# Patient Record
Sex: Male | Born: 1950
Health system: Southern US, Community
[De-identification: ages and names within clinical notes are randomized; demographics above are authoritative.]

## PROBLEM LIST (undated history)

## (undated) DIAGNOSIS — E785 Hyperlipidemia, unspecified: Secondary | ICD-10-CM

## (undated) DIAGNOSIS — I10 Essential (primary) hypertension: Secondary | ICD-10-CM

## (undated) DIAGNOSIS — E119 Type 2 diabetes mellitus without complications: Secondary | ICD-10-CM

## (undated) HISTORY — PX: SPINAL CORD STIMULATOR IMPLANT: SHX2422

## (undated) HISTORY — DX: Type 2 diabetes mellitus without complications: E11.9

## (undated) HISTORY — DX: Essential (primary) hypertension: I10

## (undated) HISTORY — DX: Hyperlipidemia, unspecified: E78.5

## (undated) HISTORY — PX: NECK SURGERY: SHX720

## (undated) HISTORY — PX: BACK SURGERY: SHX140

---

## 2004-02-24 ENCOUNTER — Inpatient Hospital Stay (HOSPITAL_COMMUNITY): Admission: RE | Admit: 2004-02-24 | Discharge: 2004-02-26 | Payer: Self-pay | Admitting: Neurosurgery

## 2004-04-03 ENCOUNTER — Ambulatory Visit (HOSPITAL_COMMUNITY): Admission: RE | Admit: 2004-04-03 | Discharge: 2004-04-03 | Payer: Self-pay | Admitting: Neurosurgery

## 2004-06-01 ENCOUNTER — Ambulatory Visit (HOSPITAL_COMMUNITY): Admission: RE | Admit: 2004-06-01 | Discharge: 2004-06-01 | Payer: Self-pay | Admitting: Neurosurgery

## 2004-06-28 ENCOUNTER — Inpatient Hospital Stay (HOSPITAL_COMMUNITY): Admission: RE | Admit: 2004-06-28 | Discharge: 2004-06-30 | Payer: Self-pay | Admitting: Neurosurgery

## 2004-08-09 ENCOUNTER — Inpatient Hospital Stay (HOSPITAL_COMMUNITY): Admission: RE | Admit: 2004-08-09 | Discharge: 2004-08-11 | Payer: Self-pay | Admitting: Neurosurgery

## 2005-03-23 ENCOUNTER — Ambulatory Visit (HOSPITAL_COMMUNITY): Admission: RE | Admit: 2005-03-23 | Discharge: 2005-03-23 | Payer: Self-pay | Admitting: Neurosurgery

## 2005-03-28 ENCOUNTER — Ambulatory Visit (HOSPITAL_COMMUNITY): Admission: RE | Admit: 2005-03-28 | Discharge: 2005-03-30 | Payer: Self-pay | Admitting: Neurosurgery

## 2005-05-02 ENCOUNTER — Ambulatory Visit (HOSPITAL_COMMUNITY): Admission: RE | Admit: 2005-05-02 | Discharge: 2005-05-02 | Payer: Self-pay | Admitting: Neurosurgery

## 2005-06-29 ENCOUNTER — Ambulatory Visit (HOSPITAL_COMMUNITY): Admission: RE | Admit: 2005-06-29 | Discharge: 2005-06-29 | Payer: Self-pay | Admitting: Neurosurgery

## 2005-07-02 ENCOUNTER — Observation Stay (HOSPITAL_COMMUNITY): Admission: RE | Admit: 2005-07-02 | Discharge: 2005-07-04 | Payer: Self-pay | Admitting: Neurosurgery

## 2005-09-12 ENCOUNTER — Ambulatory Visit (HOSPITAL_COMMUNITY): Admission: RE | Admit: 2005-09-12 | Discharge: 2005-09-12 | Payer: Self-pay | Admitting: Neurosurgery

## 2006-02-27 IMAGING — RF DG LUMBAR SPINE 2-3V
1 series · 2 of 2 positions shown · non-contrast
Comparison: none

CLINICAL DATA: Herniated disk, stimulator.
 LUMBAR SPINE TWO VIEWS
 Two spot images from C-arm fluoroscopy document placement of a stimulator device in the posterior aspect of the spinal canal in the thoracic spine.  The exact level cannot be determined from the information on the films.  
 IMPRESSION
 Thoracic stimulator device placement.

[Series 1: run · 2 of 2 slices shown]
[im 1/2]
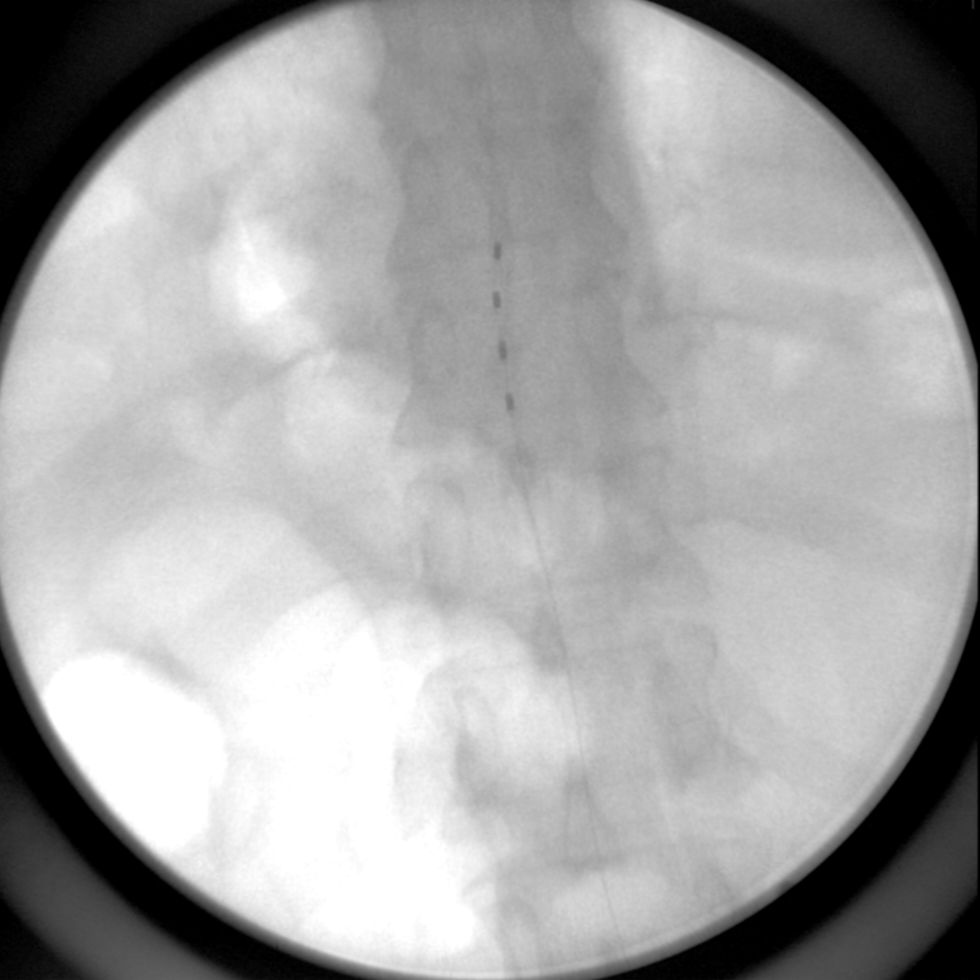
[im 2/2]
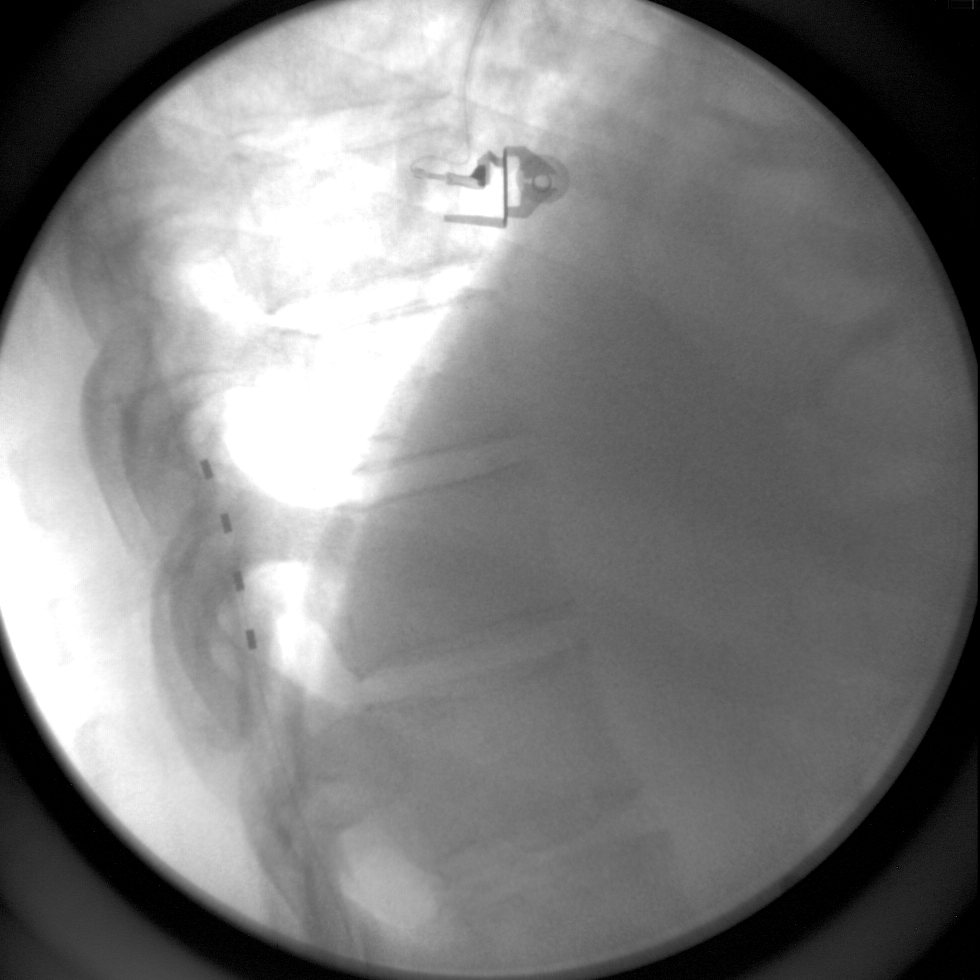

[2 of 2 positions shown; findings below may reference images not displayed]

## 2006-05-07 IMAGING — RF DG THORACIC SPINE 2V
1 series · 2 of 2 positions shown · non-contrast
Comparison: none

CLINICAL DATA: Revision of spinal cord stimulator.
 THORACIC SPINE TWO VIEW, 08/09/04
 Two intraoperative spot images are submitted.  This shows wires in place compatible with placement of spinal cord stimulator.  This appears to be located in the lower thoracic region although this is difficult to determine on these intraoperative spot images. 
 IMPRESSION
 Placement of spinal cord stimulator, likely in the lower thoracic region.

[Series 1: run · 2 of 2 slices shown]
[im 1/2]
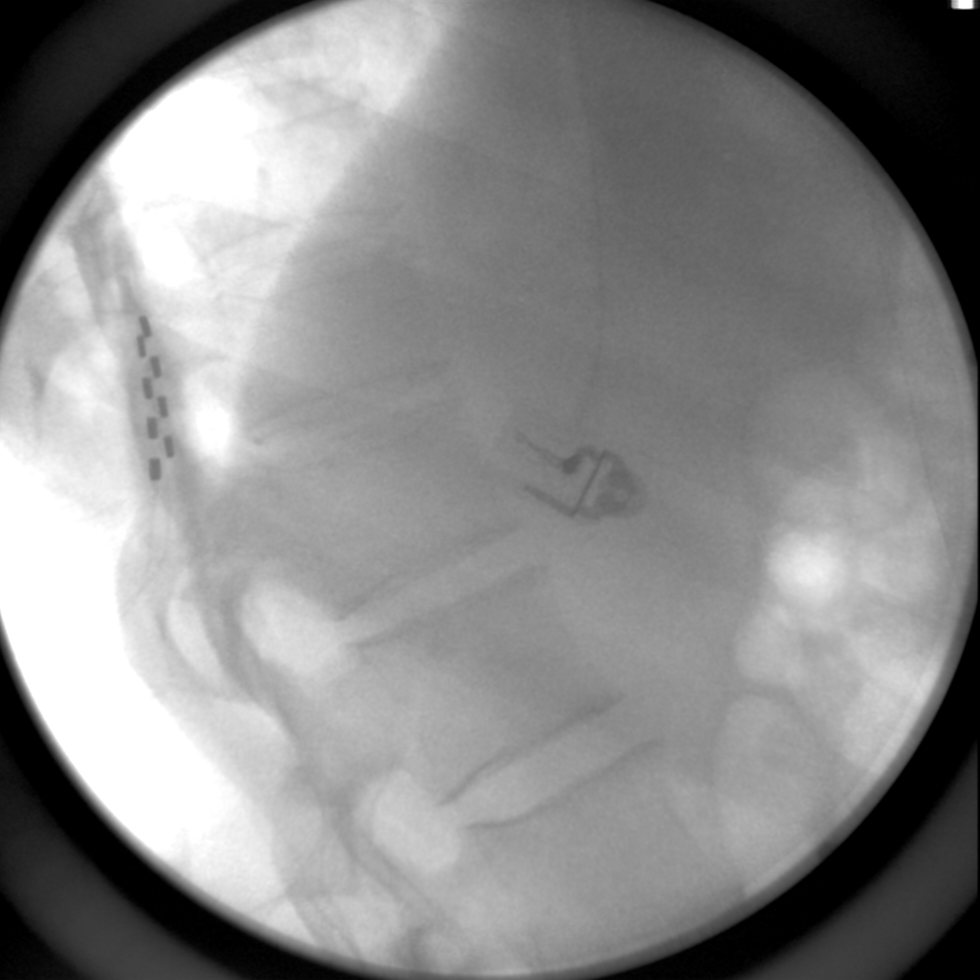
[im 2/2]
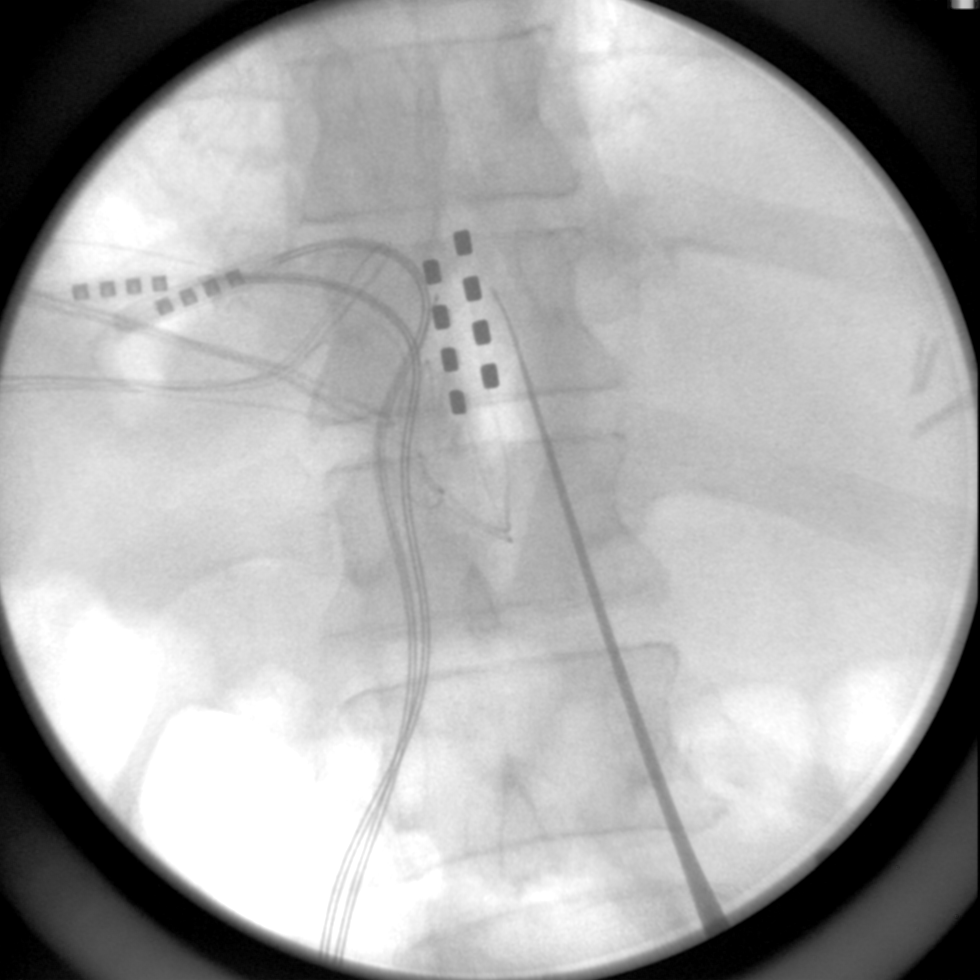

[2 of 2 positions shown; findings below may reference images not displayed]

## 2008-08-09 ENCOUNTER — Ambulatory Visit (HOSPITAL_COMMUNITY): Admission: RE | Admit: 2008-08-09 | Discharge: 2008-08-09 | Payer: Self-pay | Admitting: Family Medicine

## 2009-01-25 ENCOUNTER — Ambulatory Visit (HOSPITAL_COMMUNITY): Admission: RE | Admit: 2009-01-25 | Discharge: 2009-01-25 | Payer: Self-pay | Admitting: Family Medicine

## 2011-03-30 NOTE — Op Note (Signed)
NAME:  Anthony Espinoza, Anthony Espinoza                         ACCOUNT NO.:  [id number]   MEDICAL RECORD NO.:  [id number]                   PATIENT TYPE:  INP   LOCATION:  3008                                 FACILITY:  [facility]   PHYSICIAN:  Hewitt Shorts, M.D.            DATE OF BIRTH:  [birth year]   DATE OF PROCEDURE:  06/28/2004  DATE OF DISCHARGE:                                 OPERATIVE REPORT   PREOPERATIVE DIAGNOSIS:  Chronic right lumbar radicular pain.   POSTOPERATIVE DIAGNOSIS:  Chronic right lumbar radicular pain.   OPERATION/PROCEDURE:  Implantation of permanent spinal cord stimulator  ([employer]) via right inferior T10 laminotomy.   SURGEON:  Hewitt Shorts, M.D.   ANESTHESIA:  General endotracheal.   INDICATIONS:  The patient is a 60 year old man, status post a lumbar  diskectomy.  He has history of an old cervical cord injury and had disabling  pain postoperatively.  He has been under chronic pain management and  requested implantation of a spinal cord stimulator.  A successful trial was  performed and the patient has requested placement of a permanent spinal cord  stimulator.   DESCRIPTION OF PROCEDURE:  The patient was brought to the operating room and  placed under general endotracheal anesthesia.  The patient was turned to a  prone position.  The thoracic and lumbar regions were prepped with Betadine  soap and solution and draped in a sterile fashion.  C-arm fluoroscopy was  used to localize the T10-11 interlaminar space and then subcutaneous tissue  was infiltrated with local anesthetic and epinephrine and midline incisions  were made over the T10-11 area, carried down through the subcutaneous  tissue, carried down to the lumbar fascia. Bipolar electrocautery used to  maintain hemostasis.  The dorsal fascia was incised, the paraspinal muscles,  dissection of the spinous process of the lamina of the subperiosteal fascia.  Using C-arm fluoroscopy, we were again able to  identify the right T10-11  interlaminar space and then a laminotomy was performed using the X-max drill  and Kerrison punches.  Ligamentous flavum was carefully removed and the  thecal sac was identified.  The specified hinged lead was positioned using C-  arm fluoroscopy for guidance and positioned over the T9 and T10 level with  the left-sided leads at the midline and the right-sided leads to the right  of the midline.  Once they were successfully positioned, the dorsal fascia  was closed with interrupted undyed 1 inverted running 0 Vicryl sutures.  The  leads were secured with anchor secured with 2-0 silk suture.  We then made  an incision over the superior left buttock. The skin was then then sprayed  with local epinephrine.  Dissection was carried down through the  subcutaneous tissue and a subcutaneous pocket was created.  Then a tunneler  was passed from the thoracic to the left buttock incision and the __________  midline extensions were passed each __________  to the leads.  The hex  screws were secured with hex torque driver and boots were passed over the  connection and secured with 0 silk ties.  The extra electrode leads were  gently coiled and positioned in the subcutaneous space and the midline  extensions were connected to the Synergy pulse generator.  Four hex locking  screws were secured with a hex screwdriver and then the Synergy pulse  generator was carefully positioned in the subcutaneous pocket with the extra  portion of the extension wiring positioned under the pulse generator.  The  thoracic incision was closed with interrupted inverted 2-0 Vicryl sutures  and the skin closed with Dermabond.  The buttocks incision was closed with  interrupted inverted 0 Vicryl sutures to close the deep subcutaneous layer  and then the 2-0 interrupted sutures were placed in inverted  fascia and used to close the subcutaneous layer.  Skin was closed with  Dermabond.  The patient  tolerated the procedure well.  Estimated blood loss  was less than 25 mL.  Sponge count correct following the surgery.  The  patient is to be reversed anesthetic, extubated, and transferred to the  recovery room for further care.                                               Hewitt Shorts, M.D.    RWN/MEDQ  D:  06/28/2004  T:  06/28/2004  Job:  [id number]

## 2011-03-30 NOTE — Op Note (Signed)
NAMEISHMEL, ACEVEDO NO.:  [id number]   MEDICAL RECORD NO.:  [id number]          PATIENT TYPE:  OBV   LOCATION:  3035                         FACILITY:  [facility]   PHYSICIAN:  Hewitt Shorts, M.D.DATE OF BIRTH:  14-Jul-1951   DATE OF PROCEDURE:  07/02/2005  DATE OF DISCHARGE:                                 OPERATIVE REPORT   PREOPERATIVE DIAGNOSIS:  Recurrent right L4-5 lumbar disk herniation, lumbar  degenerative disk disease, lumbar spondylosis and lumbar radiculopathy.   POSTOP DIAGNOSIS:  Same.   PROCEDURE:  Right L4-5 lumbar laminotomy and microdiskectomy.   SURGEON:  Hewitt Shorts, M.D.   ASSISTANTJeral Fruit   ANESTHESIA:  General endotracheal.   INDICATIONS:  The patient is a 60 year old man who is status post a right L5-  S1 diskectomy in the spring of 2005 and the spring of 2006. He  suffered a  recurrent disk herniation discussion on the right at L5-S1 and a new right  L4-5 disk herniation. He underwent a diskectomy at each level.  He has since  suffered a recurrent disk herniation at the L4-5 level.  There is no  evidence of recurrence at the L5-S1 level. Decision made to return to the  operating room for a right L4-5 diskectomy for recurrent discussion.   PROCEDURE:  Patient brought to the operating room and placed under general  endotracheal anesthesia.  The patient was turned to a prone position, lumbar  region was prepped with Betadine soap solution, draped in sterile fashion.  An x-ray was taken and the L4-5 level identified.  The midline was  infiltrated with local anesthetic with epinephrine. The midline incision  made over the L4-5 level and carried down through subcutaneous tissue.  Incision was made through the previous midline incision.  Dissection was  carried down through subcutaneous tissue to the lumbar fascia. Bipolar  cautery and electrocautery were used to maintain hemostasis, lumbar fascia  was incised in the right side  the midline. The paraspinal muscles dissected  from the spinous process and lamina in subperiosteal fashion. An L4-5  laminotomy was noted and x-rays taken to confirm localization. The  microscope was draped and brought into the field to provide additional  magnification illumination, visualization. The remainder of the  decompression was performed using microdissection microsurgical technique.  The scar tissue within the laminotomy defect was carefully dissected from  the edge of the laminotomy and then the laminotomy was extended rostrally  and laterally using the X Max drill and Kerrison punches. We then dissected  the epidural space and gently retracted the thecal sac and exiting right L5  nerve root medially. The disk space was identified and we entered into the  disk space. Diskectomy was initiated and we were able to mobilize the disk  herniation which was a large fragment.  Additional small fragments of  degenerated disk material were removed as well and the thecal sac and nerve  root were freed up and good decompression of these neural structures was  achieved.  Spondylitic overgrowth on the posterior aspect of the inferior  portion of L5 was  removed using osteophyte removal tool.  In the end the  wound was irrigated bacitracin solution. All loose fragments of disk  material were removed from both disk space and the epidural space.  Hemostasis established with the use of both bipolar cautery as well as  Gelfoam soaked in thrombin and once the diskectomy was completed, hemostasis  established, we instilled 2 mL of fentanyl into the epidural space and  proceed with closure. The deep fascia closed interrupted undyed one Vicryl  sutures. The deep subcutaneous layer was closed interrupted inverted 2-0  undyed Vicryl sutures.  The subcutaneous and subcuticular layer closed  interrupted inverted 2-0 undyed Vicryl sutures. Skin edges were closed with  running locking 3-0 nylon suture.  The  wound was dressed with Adaptic and  sterile gauze. The patient tolerated the procedure well.  The estimated  blood  was 50 mL. Sponge count correct. Following surgery the patient was  turned back to supine position to be reversed from anesthetic, extubated,  transferred to recovery room for further care where he was noted to be  moving all four extremities to command.      Hewitt Shorts, M.D.  Electronically Signed     RWN/MEDQ  D:  07/02/2005  T:  07/03/2005  Job:  [id number]

## 2011-03-30 NOTE — Op Note (Signed)
NAME:  Anthony Espinoza, Anthony Espinoza                         ACCOUNT NO.:  [id number]   MEDICAL RECORD NO.:  [id number]                   PATIENT TYPE:  OIB   LOCATION:  2873                                 FACILITY:  [facility]   PHYSICIAN:  Hewitt Shorts, M.D.            DATE OF BIRTH:  May 21, 1951   DATE OF PROCEDURE:  DATE OF DISCHARGE:  06/01/2004                                 OPERATIVE REPORT   DATE OF SURGERY:  June 01, 2004.   PREOPERATIVE DIAGNOSIS:  Right lumbar radicular pain syndrome.   POSTOPERATIVE DIAGNOSIS:  Right lumbar radicular pain syndrome.   PROCEDURE:  Placement of trial spinal cord stimulator.   SURGEON:  Hewitt Shorts, MD.   ANESTHESIA:  Local anesthetic with a mixed solution, with the final  concentrations being half-percent Xylocaine, quarter-percent Marcaine,  1:200,000 epinephrine, as well as IV sedation by the Anesthesia service.   INDICATIONS FOR PROCEDURE:  The patient is a 60 year old man, who is over  three months status post a right L5-S1 lumbar laminotomy and  microdiskectomy.  His history is notable for spinal cord injury about a  dozen years ago.  Postoperatively, he has had incapacitating right lumbar  radicular pain.  Two postoperative MRI scans, one done on the day of  surgery, revealed good decompression of the thecal sac and nerve root with  complete removal of the disk herniation, and no evidence of residual or  recurrent disk herniation.  The patient has been undergoing pain management  with Dr. Thyra Breed, who requested consideration of spinal cord  stimulator.  Dr. Vear Clock felt that the patient's pain pathways may well be  abnormal related to his old spinal cord injury and felt that a spinal cord  stimulator could well be helpful to him in his pain management.   PROCEDURE:  The patient was brought to the operating room and positioned in  a prone position.  He was awake, but given varying method of intravenous  sedation including  narcotics and propofol, which was administered by the  Anesthesia service.  The lumbar region was prepped with Duraprep and draped  in a sterile fashion.  C-arm fluoroscopy was used to localize and to  position the trial spinal cord stimulator.  We identified the L2-3  intralaminar space in the right side.  The overlying skin and subcutaneous  tissue were infiltrated with the local anesthetic, and once adequate local  anesthetic had been established, a Tuohy needle was gently passed to the L2-  3 intralaminar space on the right side, a glass syringe was attached to the  needle, and using a loss of resistance technique, we were able to gently  pass the needle into the epidural space.  Then, the trial spinal cord  stimulator electrode was carefully passed in the epidural space along the  dorsal aspect and eventually positioned at the lower T-9 and mid to upper T-  10 level, with the distal-most  electrode over the inferior end plate of T-9,  the second electrode over the superior end plate of Z-61, and the third  electrode over the mid portion of T-10, and the inferior electrode over the  inferior end plate of W-96.  The stimulator was then connected and the  distribution of stimulation extended from the right buttock to the posterior  thigh and calf corresponding to the patient's distribution of radicular  pain.  We then removed the stilettes, the Tuohy needle was removed, and the  electrode was connected to the additional wire connecting system and secured  with occlusive drapes with OpSite and Tegaderm.  This electrode itself had  been secured to the skin with a tie-down using two 3-0 nylon sutures.  The C-  arm fluoroscopy unit was used throughout the procedure to carefully position  the electrode.  Following surgery, the patient was turned to the supine  position and transferred to  the recovery room for further care, to be subsequently discharged to home  when fully alert and after  having been given thorough instructions by the  representative from [employer] regarding the use of the stimulator.  The  procedure was tolerated well.  The estimated blood loss was nil.  Sponge and  needle count were correct.                                               Hewitt Shorts, M.D.    RWN/MEDQ  D:  06/01/2004  T:  06/01/2004  Job:  [id number]

## 2011-03-30 NOTE — Op Note (Signed)
NAMENASIR, BRIGHT NO.:  [id number]   MEDICAL RECORD NO.:  [id number]          PATIENT TYPE:  OIB   LOCATION:  2899                         FACILITY:  [facility]   PHYSICIAN:  Hewitt Shorts, M.D.DATE OF BIRTH:  October 02, 1951   DATE OF PROCEDURE:  03/28/2005  DATE OF DISCHARGE:                                 OPERATIVE REPORT   PREOPERATIVE DIAGNOSIS:  Recurrent right L5-S1  lumbar disk herniation,  right L4-5 lumbar disk herniation, lumbar degenerative disk disease, lumbar  spondylosis and lumbar radiculopathy.   POSTOPERATIVE DIAGNOSIS:  Recurrent right L5-S1  lumbar disk herniation,  right L4-5 lumbar disk herniation, lumbar degenerative disk disease, lumbar  spondylosis and lumbar radiculopathy.   OPERATION PERFORMED:  Right L4-5 and right L5-S1, lumbar laminotomy and  microdiskectomy with microdissection.   SURGEON:  Hewitt Shorts, M.D.   ASSISTANT:  Hilda Lias, M.D.   ANESTHESIA:  General endotracheal.   INDICATIONS FOR PROCEDURE:  The patient is a 60 year old man who presented  with right lumbar radiculopathy, myelogram and post myelogram CT scan was  done last week and revealed a large recurrent right L5-S1 lumbar disk  herniation as well as smaller right L4-5 lumbar disk herniation.  A decision was made to proceed with elective laminotomy and microdiskectomy.   DESCRIPTION OF PROCEDURE:  The patient was brought to the operating room and  placed under general endotracheal anesthesia.  The patient was turned to a  prone position.  Lumbar region was prepped with Betadine soap and solution  and draped in sterile fashion.   It should be noted that the patient has a spinal cord stimulator in place.  Prior to surgery, he shut the stimulator off and the IPG (pulse generator)  is located in the left buttock and therefore we placed the Bovie pad on the  right buttock.  The lumbar region was prepped with Betadine soap and  solution and draped in  sterile fashion.  The midline was infiltrated with  local anesthetic with epinephrine.  X-ray was taken and the L4-5 and L5-S1  levels were identified and the previous midline incision was reopened and  extended to include both levels.  We then dissected down to the subcutaneous  tissue.  Bipolar cautery and electrocautery were used to maintain  hemostasis.  Dissection was carried down to the lumbar fascia which was  incised on the right side of the midline and the paraspinal muscles were  dissected from the spinous process and lamina in subperiosteal fashion.  Self-retaining retractors were placed and an x-ray was taken and we  identified the L4 and L5 interlaminar spaces.  The previous right L5-S1  laminotomy was noted.  We then draped the operating microscope which was  brought into the field to provide additional magnification, illumination and  visualization.  This was done after an x-ray had been taken to confirm our  localization.  We dissected the scar tissue around the previous laminotomy  and extended the laminotomy and dissected in the epidural space.  There was  extensive scarring.  We were able to dissect down to the disk space  and then  were able to enter the disk space.  We identified the thecal sac and nerve  root.  These structures were gently retracted medially.  There was a good  bit of scarring but as we were able to begin the diskectomy within the disk  space, we were able to begin to identify some of the herniated fragment that  was within the scar tissue.  This was removed in piecemeal fashion but as  the diskectomy was performed, we were able to better mobilize the thecal sac  and nerve root and better expose the disk herniation and in the end, we were  able to remove all loose fragments of disk material that had herniated into  the epidural space as well as all loose fragments within the disk space  itself and we achieved good decompression of the thecal sac and  nerve root  at that level.  Once the diskectomy was completed and hemostasis  established, we proceeded onto the right L4-5 level.  Again a laminotomy was  performed using the X-Max drill and Kerrison punches. Ligamentum flavum was  carefully removed and we identified the thecal sac and exiting right L5  nerve root.  These structures were retracted medially.  There was a  subligamentous disk herniation that extended somewhat rostrally above the  level of the disk space.  We incised the overlying annular fibers and  removed the herniated fragment and then entered into the disk space.  There  was a large amount of spondylitic disk protrusion laterally and this was  mobilized as well.  In the end, a thorough diskectomy was performed with the  removal of all loose fragments of disk material from both the disk space and  epidural space and good decompression of the thecal sac and right L5 nerve  root was achieved.  The wound was irrigated extensively with bacitracin  solution and checked once for hemostasis at each level.  Once decompression  was completed and hemostasis established, we instilled 2 mL of fentanyl and  80 mg of Depo-Medrol into the epidural space and proceeded with closure.  The deep fascia was closed with #1 interrupted undyed Vicryl sutures, the  deep subcutaneous layer closed with interrupted undyed inverted #1 Vicryl  sutures and the subcutaneous and subcuticular layer were closed with  interrupted inverted 2-0 undyed Vicryl sutures and skin edges approximated  with Dermabond.  The patient tolerated the procedure well.  The estimated  blood loss for this procedure was 75 mL.  Sponge, needle and instrument  counts were correct.  Following surgery the patient was turned back to  supine position to be reversed from anesthetic, extubated and transferred to  recovery room for further care.     RWN/MEDQ  D:  03/28/2005  T:  03/28/2005  Job:  [id number]

## 2011-03-30 NOTE — Op Note (Signed)
Anthony Espinoza, Anthony NO.:  [id number]   MEDICAL RECORD NO.:  [id number]          PATIENT TYPE:  OIB   LOCATION:  3172                         FACILITY:  [facility]   PHYSICIAN:  Hewitt Shorts, M.D.DATE OF BIRTH:  [birth year]   DATE OF PROCEDURE:  08/09/2004  DATE OF DISCHARGE:                                 OPERATIVE REPORT   PREOPERATIVE DIAGNOSES:  Chronic right lumbar radicular pain syndrome.   POSTOPERATIVE DIAGNOSES:  Chronic right lumbar radicular pain syndrome.   PROCEDURE:  Revision of [employer] spinal cord stimulator electrode via right  T10 laminectomy.   SURGEON:  Hewitt Shorts, M.D.   ANESTHESIA:  Local anesthetic with IV sedation, followed by general  anesthetic.   INDICATIONS FOR PROCEDURE:  The patient is a 60 year old male who has had a  chronic, right lumbar  radiculopathy.  About 5 weeks ago, a permanent spinal cord stimulator was  implanted.  Initially, we had stimulation of the right leg, but eventually,  stimulation was either of the left leg or of the right abdominal  musculature.  X-rays showed that the electrode had not migrated but a  decision was made to proceed with revision of the spinal cord stimulator,  and the patient was brought to surgery for such.   It was planned to perform the procedure with local anesthetic with IV  sedation so that we could test the new positioning of the electrode  intraoperatively.  However, during the procedure, the patient experienced  too much discomfort and therefore, the anesthesia service placed a LMA and  patient was placed under general anesthesia.   DESCRIPTION OF PROCEDURE:  The patient was brought to the operating room,  placed in a prone position.  The posterior thoracic and lumbar region was  prepped with Betadine soap and solution, draped in a sterile fashion. The  skin overlying the previous thoracic incision was infiltrated both  superficially and subsequently deeply with  local anesthetic.  We used a  mixture, the final solution of which was 0.5% Xylocaine, 0.25% Marcaine and  1:200,000 epinephrine.   Once adequate superficial anesthesia was created, the incision was reopened  and we identified the electrode wires, the tie downs and connections. We  then infiltrated the fascia more deeply.  We opened the fascia. The patient  began to experience some discomfort with that, however, the greatest  discomfort he experienced was during the mobilization of the paraspinal  muscles through a subperiosteal elevation,  despite further infiltration of the tissues with local anesthesia.  The  patient was given IV sedation and analgesia as well.  After a number of  attempts to achieve adequate local anesthesia, it was felt that we could not  proceed with the procedure in that approach. Therefore, Dr. Randa Evens from the  anesthesia service was able to position a LMA and initiate general  anesthesia, and the patient did well with the anesthesia through the rest of  the procedure.   We dissected the scar tissue from the previous right T10 hemilaminotomy, and  using the X-Max drill and Kerrison punches, we were able to extend  the  laminotomy rostrally.  We identified the electrode as it entered into the  laminotomy into the epidural space.  Using loupe magnification, we were able  to dissect the scar tissue around the electrode.  The laminotomy was  extended medially and rostrally, so that we could reposition the electrode.  Using C-arm fluoroscopic guidance in both AP and lateral planes, we were  able to position the electrode to a more dorsal and less right lateral  position.   Once the electrode was in good position, we were able to proceed with  closure.  The deep fascia was closed with interrupted undyed 2-0 interrupted  sutures.  The tie downs were secured with 2-0 silk sutures.  We were able to  close the subcutaneous and subcuticular layer with interrupted,  inverted 2-0  and 3-0 interrupted sutures, and the skin was closed with Indermil.   The patient tolerated the procedure well. Estimated blood loss was 50 cc.  Sponge count correct. Following surgery, the patient was turned back to the  supine position, reversed from anesthetic, the LMA was removed, he was  subsequently and transferred to the recovery room for further care.  The patient was noted to be moving all 4 extremities to command, although he  had moderate discomfort with the right lower extremity.       RWN/MEDQ  D:  08/09/2004  T:  08/09/2004  Job:  [id number]

## 2011-03-30 NOTE — Op Note (Signed)
NAME:  Anthony Espinoza, Anthony Espinoza                         ACCOUNT NO.:  [id number]   MEDICAL RECORD NO.:  [id number]                   PATIENT TYPE:  OIB   LOCATION:  3013                                 FACILITY:  [facility]   PHYSICIAN:  Hewitt Shorts, M.D.            DATE OF BIRTH:  25-May-1951   DATE OF PROCEDURE:  02/24/2004  DATE OF DISCHARGE:                                 OPERATIVE REPORT   PREOPERATIVE DIAGNOSIS:  Right L5-S1 lumbar disk herniation, lumbar  degenerative disk disease, lumbar spondylosis and lumbar radiculopathy.   POSTOPERATIVE DIAGNOSIS:  Right L5-S1 lumbar disk herniation, lumbar  degenerative disk disease, lumbar spondylosis and lumbar radiculopathy.   PROCEDURE:  Right L5-S1 lumbar laminotomy and microdiskectomy.   SURGEON:  Hewitt Shorts, M.D.   ASSISTANT:  Hilda Lias, M.D.   ANESTHESIA:  General endotracheal.   INDICATIONS:  This is a 60 year old man who presented with right lumbar  radiculopathy and was found to have a large right L5-S1 disk herniation  superimposed upon underlying degenerative disk disease and spondylosis.  The  incision was made to proceed with elective laminotomy and microdiskectomy.   DESCRIPTION OF PROCEDURE:  The patient was brought to the operating room and  placed under general endotracheal anesthesia.  The patient was turned to a  prone position.  The lumbar region was prepped with Betadine soap and  solution and draped in a sterile fashion.  The midline was infiltrated with  a local anesthetic with epinephrine.  An x-ray was taken and the L5-S1 level  was identified.  Then a midline incision was made and carried down to the  subcutaneous tissue.  Bipolar cautery and electrocautery were used to  maintain hemostasis.  Dissection was carried down to the lumbar fascia which  was incised on the right side of the midline in the paraspinal muscles,  dissecting the spinous process and lamina in a subperiosteal fashion.  Another  x-ray was taken and the L5-S1 intralaminar space was identified.  A  laminotomy was performed using the black Max drill and then the microscope  was draped and brought into the field to provide instrument magnification,  illumination and visualization, and the remainder of the decompression  performed using microdissection using microsurgical technique.  The ligament  of flavum was carefully resected, and we identified the thecal sac and right  S1 nerve root.  These structures were gently retracted medially and we  exposed the disk herniation.  The fragment was removed and then we entered  into the disk space and proceeded with a thorough diskectomy using a variety  of pituitary rongeurs.  There was spondylitic overgrowth from the posterior  aspect of both the L5 and the S1 vertebrae which was carefully removed.  In  the end, all loose fragments of disk material were removed from both disk  spaces and the epidural space.  A good decompression of the thecal sac and  nerve root  was achieved.  Once the decompression was completed, hemostasis  was established with the use of bipolar cautery, and then instilled 2 mL of  fentanyl and 80 mg of Depo-Medrol into the epidural space and proceeded with  closure.  The deep fascia was closed with interrupted undyed Vicryl suture.  The subcutaneous and subcuticular layers were closed with interrupted  inverted 2-0 undyed sutures.  The skin was  reapproximated with Dermabond.  The patient tolerated the procedure well.  Estimated blood loss was 25 mL.  Sponge and needle counts were correct.  Following surgery, the patient was turned back to the supine position where  he was reversed from the anesthetic, extubated and transferred to the  recovery room for further care.                                               Hewitt Shorts, M.D.    RWN/MEDQ  D:  02/24/2004  T:  02/25/2004  Job:  [id number]

## 2015-01-14 ENCOUNTER — Other Ambulatory Visit (HOSPITAL_COMMUNITY): Payer: Self-pay | Admitting: Family Medicine

## 2015-01-14 DIAGNOSIS — R1084 Generalized abdominal pain: Secondary | ICD-10-CM

## 2015-01-14 DIAGNOSIS — R109 Unspecified abdominal pain: Secondary | ICD-10-CM

## 2015-01-19 ENCOUNTER — Ambulatory Visit (HOSPITAL_COMMUNITY)
Admission: RE | Admit: 2015-01-19 | Discharge: 2015-01-19 | Disposition: A | Discharge status: Home / Self Care | Payer: Medicare Other | Source: Ambulatory Visit | Attending: Family Medicine | Admitting: Family Medicine

## 2015-01-19 DIAGNOSIS — R102 Pelvic and perineal pain: Secondary | ICD-10-CM | POA: Insufficient documentation

## 2015-01-19 DIAGNOSIS — K760 Fatty (change of) liver, not elsewhere classified: Secondary | ICD-10-CM | POA: Diagnosis not present

## 2015-01-19 DIAGNOSIS — R1084 Generalized abdominal pain: Secondary | ICD-10-CM

## 2015-01-19 DIAGNOSIS — I10 Essential (primary) hypertension: Secondary | ICD-10-CM | POA: Diagnosis not present

## 2015-01-19 DIAGNOSIS — R109 Unspecified abdominal pain: Secondary | ICD-10-CM | POA: Insufficient documentation

## 2015-01-19 DIAGNOSIS — N281 Cyst of kidney, acquired: Secondary | ICD-10-CM | POA: Insufficient documentation

## 2016-08-15 DIAGNOSIS — N400 Enlarged prostate without lower urinary tract symptoms: Secondary | ICD-10-CM | POA: Insufficient documentation

## 2016-08-15 DIAGNOSIS — E1169 Type 2 diabetes mellitus with other specified complication: Secondary | ICD-10-CM | POA: Insufficient documentation

## 2016-08-15 DIAGNOSIS — I152 Hypertension secondary to endocrine disorders: Secondary | ICD-10-CM | POA: Insufficient documentation

## 2016-08-15 DIAGNOSIS — E785 Hyperlipidemia, unspecified: Secondary | ICD-10-CM

## 2016-08-15 DIAGNOSIS — I10 Essential (primary) hypertension: Secondary | ICD-10-CM

## 2016-08-15 DIAGNOSIS — E1159 Type 2 diabetes mellitus with other circulatory complications: Secondary | ICD-10-CM | POA: Insufficient documentation

## 2016-08-21 ENCOUNTER — Encounter (INDEPENDENT_AMBULATORY_CARE_PROVIDER_SITE_OTHER): Payer: Self-pay | Admitting: *Deleted

## 2017-04-18 ENCOUNTER — Other Ambulatory Visit: Payer: Self-pay | Admitting: Neurosurgery

## 2017-04-18 DIAGNOSIS — M4726 Other spondylosis with radiculopathy, lumbar region: Secondary | ICD-10-CM

## 2017-05-03 ENCOUNTER — Ambulatory Visit
Admission: RE | Admit: 2017-05-03 | Discharge: 2017-05-03 | Disposition: A | Discharge status: Home / Self Care | Payer: Medicare Other | Source: Ambulatory Visit | Attending: Neurosurgery | Admitting: Neurosurgery

## 2017-05-03 DIAGNOSIS — M4726 Other spondylosis with radiculopathy, lumbar region: Secondary | ICD-10-CM

## 2017-05-03 MED ORDER — IOPAMIDOL (ISOVUE-M 200) INJECTION 41%
15.0000 mL | Freq: Once | INTRAMUSCULAR | Status: AC
  Administered 2017-05-03: 15 mL via INTRATHECAL

## 2017-05-03 MED ORDER — DIAZEPAM 5 MG PO TABS
10.0000 mg | ORAL_TABLET | Freq: Once | ORAL | Status: AC
  Administered 2017-05-03: 10 mg via ORAL

## 2017-05-03 NOTE — Discharge Instructions (Signed)

## 2017-07-22 LAB — HM HEPATITIS C SCREENING LAB: HM Hepatitis Screen: NEGATIVE

## 2018-12-22 DIAGNOSIS — I10 Essential (primary) hypertension: Secondary | ICD-10-CM | POA: Diagnosis not present

## 2018-12-22 DIAGNOSIS — E1159 Type 2 diabetes mellitus with other circulatory complications: Secondary | ICD-10-CM | POA: Diagnosis not present

## 2018-12-24 DIAGNOSIS — E669 Obesity, unspecified: Secondary | ICD-10-CM | POA: Diagnosis not present

## 2018-12-24 DIAGNOSIS — E1169 Type 2 diabetes mellitus with other specified complication: Secondary | ICD-10-CM | POA: Diagnosis not present

## 2018-12-24 DIAGNOSIS — Z6832 Body mass index (BMI) 32.0-32.9, adult: Secondary | ICD-10-CM | POA: Diagnosis not present

## 2018-12-24 DIAGNOSIS — E1159 Type 2 diabetes mellitus with other circulatory complications: Secondary | ICD-10-CM | POA: Diagnosis not present

## 2018-12-24 DIAGNOSIS — I10 Essential (primary) hypertension: Secondary | ICD-10-CM | POA: Diagnosis not present

## 2018-12-24 DIAGNOSIS — E785 Hyperlipidemia, unspecified: Secondary | ICD-10-CM | POA: Diagnosis not present

## 2019-03-24 DIAGNOSIS — E1169 Type 2 diabetes mellitus with other specified complication: Secondary | ICD-10-CM | POA: Diagnosis not present

## 2019-03-24 DIAGNOSIS — E785 Hyperlipidemia, unspecified: Secondary | ICD-10-CM | POA: Diagnosis not present

## 2019-03-25 DIAGNOSIS — I10 Essential (primary) hypertension: Secondary | ICD-10-CM | POA: Diagnosis not present

## 2019-03-25 DIAGNOSIS — E1169 Type 2 diabetes mellitus with other specified complication: Secondary | ICD-10-CM | POA: Diagnosis not present

## 2019-03-25 DIAGNOSIS — E785 Hyperlipidemia, unspecified: Secondary | ICD-10-CM | POA: Diagnosis not present

## 2019-03-25 DIAGNOSIS — E1159 Type 2 diabetes mellitus with other circulatory complications: Secondary | ICD-10-CM | POA: Diagnosis not present

## 2019-08-25 DIAGNOSIS — R69 Illness, unspecified: Secondary | ICD-10-CM | POA: Diagnosis not present

## 2019-10-22 ENCOUNTER — Other Ambulatory Visit: Payer: Self-pay

## 2019-10-22 NOTE — Progress Notes (Signed)
This encounter was created in error - please disregard.

## 2019-10-23 ENCOUNTER — Encounter: Payer: Self-pay | Admitting: Family Medicine

## 2019-10-27 ENCOUNTER — Other Ambulatory Visit: Payer: Self-pay

## 2019-10-28 ENCOUNTER — Ambulatory Visit (INDEPENDENT_AMBULATORY_CARE_PROVIDER_SITE_OTHER): Payer: Medicare HMO | Admitting: Family Medicine

## 2019-10-28 ENCOUNTER — Encounter: Payer: Self-pay | Admitting: Family Medicine

## 2019-10-28 VITALS — BP 163/93 | HR 119 | Temp 99.3°F | Ht 72.0 in | Wt 239.2 lb

## 2019-10-28 DIAGNOSIS — E1169 Type 2 diabetes mellitus with other specified complication: Secondary | ICD-10-CM | POA: Diagnosis not present

## 2019-10-28 DIAGNOSIS — I10 Essential (primary) hypertension: Secondary | ICD-10-CM

## 2019-10-28 DIAGNOSIS — E1159 Type 2 diabetes mellitus with other circulatory complications: Secondary | ICD-10-CM | POA: Diagnosis not present

## 2019-10-28 DIAGNOSIS — Z23 Encounter for immunization: Secondary | ICD-10-CM | POA: Diagnosis not present

## 2019-10-28 DIAGNOSIS — M26609 Unspecified temporomandibular joint disorder, unspecified side: Secondary | ICD-10-CM

## 2019-10-28 DIAGNOSIS — N400 Enlarged prostate without lower urinary tract symptoms: Secondary | ICD-10-CM

## 2019-10-28 DIAGNOSIS — E785 Hyperlipidemia, unspecified: Secondary | ICD-10-CM | POA: Diagnosis not present

## 2019-10-28 LAB — BAYER DCA HB A1C WAIVED: HB A1C (BAYER DCA - WAIVED): 6.9 % (ref <7.0)

## 2019-10-28 MED ORDER — METFORMIN HCL 500 MG PO TABS: 500.0000 mg | ORAL_TABLET | Freq: Three times a day (TID) | ORAL | 1 refills | Status: DC

## 2019-10-28 MED ORDER — TETANUS-DIPHTH-ACELL PERTUSSIS 5-2.5-18.5 LF-MCG/0.5 IM SUSP: 0.5000 mL | Freq: Once | INTRAMUSCULAR | 0 refills | Status: AC

## 2019-10-28 MED ORDER — SHINGRIX 50 MCG/0.5ML IM SUSR: 0.5000 mL | Freq: Once | INTRAMUSCULAR | 0 refills | Status: AC

## 2019-10-28 MED ORDER — AMLODIPINE BESYLATE 5 MG PO TABS: 5.0000 mg | ORAL_TABLET | Freq: Every day | ORAL | 2 refills | Status: DC

## 2019-10-28 MED ORDER — ATORVASTATIN CALCIUM 20 MG PO TABS: ORAL_TABLET | ORAL | 2 refills | Status: DC

## 2019-10-28 MED ORDER — CYCLOBENZAPRINE HCL 10 MG PO TABS: 10.0000 mg | ORAL_TABLET | Freq: Three times a day (TID) | ORAL | 2 refills | Status: DC | PRN

## 2019-10-28 MED ORDER — TAMSULOSIN HCL 0.4 MG PO CAPS: 0.4000 mg | ORAL_CAPSULE | Freq: Every day | ORAL | 2 refills | Status: DC

## 2019-10-28 NOTE — Patient Instructions (Signed)

## 2019-10-28 NOTE — Progress Notes (Signed)
New Patient Office Visit  Assessment & Plan:  1. DM type 2 with diabetic dyslipidemia ([place]) Lab Results  Component Value Date   HGBA1C 6.9 10/28/2019   - Diabetes is at goal of A1c < 7. - Medications: continue current medications - Patient is currently taking a statin. Patient is not taking an ACE-inhibitor/ARB.  - Last foot exam: 10/28/2019 - Last diabetic eye exam: Patient reports it has been within the last year, we will request this record from [place] - Urine Microalbumin/Creat Ratio: 10/28/2019 - Instruction/counseling given: discussed foot care - atorvastatin (LIPITOR) 20 MG tablet; TAKE ONE TABLET BY MOUTH ONCE DAILY FOR CHOLESTEROL  Dispense: 90 tablet; Refill: 2 - Microalbumin / creatinine urine ratio - hgba1c - metFORMIN (GLUCOPHAGE) 500 MG tablet; Take 1 tablet (500 mg total) by mouth 3 (three) times daily.  Dispense: 270 tablet; Refill: 1 - CBC with Differential/Platelet - CMP14+EGFR - Lipid Panel  2. Hypertension associated with diabetes ([place]) - Patient reports he is typically well controlled with amlodipine 5 mg once daily but has been out of his medication for about a week now.  Refills sent. - amLODipine (NORVASC) 5 MG tablet; Take 1 tablet (5 mg total) by mouth daily.  Dispense: 90 tablet; Refill: 2 - CBC with Differential/Platelet - CMP14+EGFR - Lipid Panel  3. Benign prostatic hyperplasia without lower urinary tract symptoms - Well controlled on current regimen.  - tamsulosin (FLOMAX) 0.4 MG CAPS capsule; Take 1 capsule (0.4 mg total) by mouth daily after supper.  Dispense: 90 capsule; Refill: 2 - CBC with Differential/Platelet - CMP14+EGFR - PSA, total and free  4. TMJ (temporomandibular joint disorder) - Discussed NSAIDs and muscle relaxers for pain relief.  Education provided on TMJ.  Discussed if it does not improve he needs to see his dentist. - cyclobenzaprine (FLEXERIL) 10 MG tablet; Take 1 tablet (10 mg total) by mouth 3 (three)  times daily as needed for muscle spasms.  Dispense: 30 tablet; Refill: 2  5. Immunization due - SHINGRIX injection; Inject 0.5 mLs into the muscle once for 1 dose.  Dispense: 0.5 mL; Refill: 0 - Tdap (BOOSTRIX) 5-2.5-18.5 LF-MCG/0.5 injection; Inject 0.5 mLs into the muscle once for 1 dose.  Dispense: 0.5 mL; Refill: 0   Follow-up: Return in about 3 months (around 01/26/2020) for DM.   Hendricks Limes, MSN, APRN, [name]-C [facility]  Subjective:  Patient ID: Anthony Espinoza, male    DOB: June 25, 1951  Age: 68 y.o. MRN: [id number]  Patient Care Team: [name], [name] as PCP - General (Family Medicine)  CC:  Chief Complaint  Patient presents with  . New Patient (Initial Visit)    [place]  . Establish Care    HPI Anthony Espinoza presents to establish care. He is transferring care from Dr. Murrell Redden office as he has retired and the office has closed.    Patient complains at the right side of his jaw and the joint started bothering him about 2 weeks ago.  He describes the pain as feeling like an ice pick stabbing into his jaw with pain radiating down his neck and into his ear.   Review of Systems  Constitutional: Negative for chills, fever, malaise/fatigue and weight loss.  HENT: Positive for ear pain. Negative for congestion, ear discharge, nosebleeds, sinus pain, sore throat and tinnitus.   Eyes: Negative for blurred vision, double vision, pain, discharge and redness.  Respiratory: Negative for cough, shortness of breath and wheezing.  Cardiovascular: Negative for chest pain, palpitations and leg swelling.  Gastrointestinal: Negative for abdominal pain, constipation, diarrhea, heartburn, nausea and vomiting.  Genitourinary: Negative for dysuria, frequency and urgency.  Musculoskeletal: Negative for myalgias.  Skin: Negative for rash.  Neurological: Negative for dizziness, seizures, weakness and headaches.  Psychiatric/Behavioral: Negative for depression,  substance abuse and suicidal ideas. The patient is not nervous/anxious.     Current Outpatient Medications:  .  amLODipine (NORVASC) 5 MG tablet, Take 1 tablet (5 mg total) by mouth daily., Disp: 90 tablet, Rfl: 2 .  aspirin 81 MG chewable tablet, Chew 81 mg by mouth daily., Disp: , Rfl:  .  atorvastatin (LIPITOR) 20 MG tablet, TAKE ONE TABLET BY MOUTH ONCE DAILY FOR CHOLESTEROL, Disp: 90 tablet, Rfl: 2 .  metFORMIN (GLUCOPHAGE) 500 MG tablet, Take 1 tablet (500 mg total) by mouth 3 (three) times daily., Disp: 270 tablet, Rfl: 1 .  tamsulosin (FLOMAX) 0.4 MG CAPS capsule, Take 1 capsule (0.4 mg total) by mouth daily after supper., Disp: 90 capsule, Rfl: 2 .  cyclobenzaprine (FLEXERIL) 10 MG tablet, Take 1 tablet (10 mg total) by mouth 3 (three) times daily as needed for muscle spasms., Disp: 30 tablet, Rfl: 2  Allergies  Allergen Reactions  . Sulfa Antibiotics Other (See Comments)    Ulcers in mouth    Past Medical History:  Diagnosis Date  . Diabetes mellitus without complication ([place])   . Hyperlipidemia   . Hypertension     Past Surgical History:  Procedure Laterality Date  . BACK SURGERY     x6  . NECK SURGERY    . SPINAL CORD STIMULATOR IMPLANT      Family History  Problem Relation Age of Onset  . Diabetes Mother   . Parkinson's disease Brother     Social History   Socioeconomic History  . Marital status: Divorced    Spouse name: Not on file  . Number of children: Not on file  . Years of education: Not on file  . Highest education level: Not on file  Occupational History  . Not on file  Tobacco Use  . Smoking status: Former Smoker    Types: Cigarettes    Quit date: 10/28/1999    Years since quitting: 20.0  . Smokeless tobacco: Never Used  Substance and Sexual Activity  . Alcohol use: Not Currently  . Drug use: Never  . Sexual activity: Not on file  Other Topics Concern  . Not on file  Social History Narrative  . Not on file   Social Determinants of  Health   Financial Resource Strain:   . Difficulty of Paying Living Expenses: Not on file  Food Insecurity:   . Worried About Charity fundraiser in the Last Year: Not on file  . Ran Out of Food in the Last Year: Not on file  Transportation Needs:   . Lack of Transportation (Medical): Not on file  . Lack of Transportation (Non-Medical): Not on file  Physical Activity:   . Days of Exercise per Week: Not on file  . Minutes of Exercise per Session: Not on file  Stress:   . Feeling of Stress : Not on file  Social Connections:   . Frequency of Communication with Friends and Family: Not on file  . Frequency of Social Gatherings with Friends and Family: Not on file  . Attends Religious Services: Not on file  . Active Member of Clubs or Organizations: Not on file  . Attends Club or  Organization Meetings: Not on file  . Marital Status: Not on file  Intimate Partner Violence:   . Fear of Current or Ex-Partner: Not on file  . Emotionally Abused: Not on file  . Physically Abused: Not on file  . Sexually Abused: Not on file    Objective:   Today's Vitals: BP (!) 163/93   Pulse (!) 119   Temp 99.3 F (37.4 C) (Temporal)   Ht 6' (1.829 m)   Wt 239 lb 3.2 oz (108.5 kg)   SpO2 96%   BMI 32.44 kg/m   Physical Exam Vitals reviewed.  Constitutional:      General: He is not in acute distress.    Appearance: Normal appearance. He is obese. He is not ill-appearing, toxic-appearing or diaphoretic.  HENT:     Head: Normocephalic and atraumatic.     Right Ear: Tympanic membrane, ear canal and external ear normal. There is no impacted cerumen.     Left Ear: Tympanic membrane, ear canal and external ear normal. There is no impacted cerumen.     Mouth/Throat:     Comments: Clicking palpated in right temporomandibular joint. Eyes:     General: No scleral icterus.       Right eye: No discharge.        Left eye: No discharge.     Conjunctiva/sclera: Conjunctivae normal.  Cardiovascular:      Rate and Rhythm: Normal rate and regular rhythm.     Heart sounds: Normal heart sounds. No murmur. No friction rub. No gallop.   Pulmonary:     Effort: Pulmonary effort is normal. No respiratory distress.     Breath sounds: Normal breath sounds. No stridor. No wheezing, rhonchi or rales.  Musculoskeletal:        General: Normal range of motion.     Cervical back: Normal range of motion.  Skin:    General: Skin is warm and dry.  Neurological:     Mental Status: He is alert and oriented to person, place, and time. Mental status is at baseline.  Psychiatric:        Mood and Affect: Mood normal.        Behavior: Behavior normal.        Thought Content: Thought content normal.        Judgment: Judgment normal.

## 2019-10-29 LAB — CBC WITH DIFFERENTIAL/PLATELET
Basophils Absolute: 0.1 10*3/uL (ref 0.0–0.2)
Basos: 1 %
EOS (ABSOLUTE): 0.2 10*3/uL (ref 0.0–0.4)
Eos: 3 %
Hematocrit: 46.7 % (ref 37.5–51.0)
Hemoglobin: 16.4 g/dL (ref 13.0–17.7)
Immature Grans (Abs): 0 10*3/uL (ref 0.0–0.1)
Immature Granulocytes: 0 %
Lymphocytes Absolute: 1.5 10*3/uL (ref 0.7–3.1)
Lymphs: 24 %
MCH: 30.7 pg (ref 26.6–33.0)
MCHC: 35.1 g/dL (ref 31.5–35.7)
MCV: 87 fL (ref 79–97)
Monocytes Absolute: 0.6 10*3/uL (ref 0.1–0.9)
Monocytes: 10 %
Neutrophils Absolute: 3.8 10*3/uL (ref 1.4–7.0)
Neutrophils: 62 %
Platelets: 278 10*3/uL (ref 150–450)
RBC: 5.35 x10E6/uL (ref 4.14–5.80)
RDW: 14.1 % (ref 11.6–15.4)
WBC: 6.2 10*3/uL (ref 3.4–10.8)

## 2019-10-29 LAB — CMP14+EGFR
ALT: 17 IU/L (ref 0–44)
AST: 19 IU/L (ref 0–40)
Albumin/Globulin Ratio: 1.9 (ref 1.2–2.2)
Albumin: 4.6 g/dL (ref 3.8–4.8)
Alkaline Phosphatase: 65 IU/L (ref 39–117)
BUN/Creatinine Ratio: 23 (ref 10–24)
BUN: 17 mg/dL (ref 8–27)
Bilirubin Total: 0.4 mg/dL (ref 0.0–1.2)
CO2: 20 mmol/L (ref 20–29)
Calcium: 8.8 mg/dL (ref 8.6–10.2)
Chloride: 103 mmol/L (ref 96–106)
Creatinine, Ser: 0.73 mg/dL — ABNORMAL LOW (ref 0.76–1.27)
GFR calc Af Amer: 110 mL/min/{1.73_m2} (ref >59)
GFR calc non Af Amer: 95 mL/min/{1.73_m2} (ref >59)
Globulin, Total: 2.4 g/dL (ref 1.5–4.5)
Glucose: 107 mg/dL — ABNORMAL HIGH (ref 65–99)
Potassium: 4.3 mmol/L (ref 3.5–5.2)
Sodium: 138 mmol/L (ref 134–144)
Total Protein: 7 g/dL (ref 6.0–8.5)

## 2019-10-29 LAB — PSA, TOTAL AND FREE
PSA, Free Pct: 28.4 %
PSA, Free: 0.88 ng/mL
Prostate Specific Ag, Serum: 3.1 ng/mL (ref 0.0–4.0)

## 2019-10-29 LAB — LIPID PANEL
Chol/HDL Ratio: 3.3 ratio (ref 0.0–5.0)
Cholesterol, Total: 147 mg/dL (ref 100–199)
HDL: 44 mg/dL (ref >39)
LDL Chol Calc (NIH): 84 mg/dL (ref 0–99)
Triglycerides: 106 mg/dL (ref 0–149)
VLDL Cholesterol Cal: 19 mg/dL (ref 5–40)

## 2019-10-29 LAB — MICROALBUMIN / CREATININE URINE RATIO
Creatinine, Urine: 93 mg/dL
Microalb/Creat Ratio: 3 mg/g creat (ref 0–29)
Microalbumin, Urine: 3 ug/mL

## 2019-12-30 LAB — HM DIABETES EYE EXAM

## 2019-12-31 ENCOUNTER — Encounter: Payer: Self-pay | Admitting: Family Medicine

## 2020-01-22 DIAGNOSIS — M5136 Other intervertebral disc degeneration, lumbar region: Secondary | ICD-10-CM | POA: Diagnosis not present

## 2020-01-22 DIAGNOSIS — M545 Low back pain: Secondary | ICD-10-CM | POA: Diagnosis not present

## 2020-01-22 DIAGNOSIS — Z6831 Body mass index (BMI) 31.0-31.9, adult: Secondary | ICD-10-CM | POA: Diagnosis not present

## 2020-01-22 DIAGNOSIS — M48062 Spinal stenosis, lumbar region with neurogenic claudication: Secondary | ICD-10-CM | POA: Diagnosis not present

## 2020-01-22 DIAGNOSIS — I10 Essential (primary) hypertension: Secondary | ICD-10-CM | POA: Diagnosis not present

## 2020-01-25 NOTE — Progress Notes (Signed)
Assessment & Plan:  1. DM type 2 with diabetic dyslipidemia ([place]) Lab Results  Component Value Date   HGBA1C 6.8 01/26/2020   HGBA1C 6.9 10/28/2019     - Diabetes is at goal of A1c < 7. - Medications: continue current medications - Home glucose monitoring: not doing - Patient is currently taking a statin. Patient is not taking an ACE-inhibitor/ARB.  - Last foot exam: 10/28/2019 - Last diabetic eye exam: 12/30/2019 - Urine Microalbumin/Creat Ratio: 10/28/2019 - CMP14+EGFR - Bayer DCA Hb A1c Waived  2. Hypertension associated with diabetes ([place]) - Uncontrolled but improving. Amlodipine increased from 5 mg to 10 mg once daily.  - amLODipine (NORVASC) 10 MG tablet; Take 1 tablet (10 mg total) by mouth daily.  Dispense: 90 tablet; Refill: 1   Return in about 6 months (around 07/28/2020) for annual physical.  Hendricks Limes, MSN, APRN, [name]-C [facility]  Subjective:    Patient ID: Anthony Espinoza, male    DOB: 11-08-1951, 69 y.o.   MRN: [id number]  Patient Care Team: [name], [name] as PCP - General (Family Medicine)   Chief Complaint:  Chief Complaint  Patient presents with  . Diabetes    3 month follow up    HPI: Anthony Espinoza is a 69 y.o. male presenting on 01/26/2020 for Diabetes (3 month follow up)  Diabetes: Patient presents for follow up of diabetes. Current symptoms include: none. Known diabetic complications: none. Medication compliance: yes. Current diet: in general, a "healthy" diet  . Current exercise: walking. Home blood sugar records: patient does not check sugars. Is he  on ACE inhibitor or angiotensin II receptor blocker? No. Is he on a statin? Yes.   Lab Results  Component Value Date   HGBA1C 6.8 01/26/2020   HGBA1C 6.9 10/28/2019   Lab Results  Component Value Date   LDLCALC 84 10/28/2019   CREATININE 0.73 (L) 10/28/2019     New complaints: None.  Social history:  Relevant past medical, surgical, family and social  history reviewed and updated as indicated. Interim medical history since our last visit reviewed.  Allergies and medications reviewed and updated.  DATA REVIEWED: CHART IN EPIC  ROS: Negative unless specifically indicated above in HPI.    Current Outpatient Medications:  .  amLODipine (NORVASC) 10 MG tablet, Take 1 tablet (10 mg total) by mouth daily., Disp: 90 tablet, Rfl: 1 .  aspirin 81 MG chewable tablet, Chew 81 mg by mouth daily., Disp: , Rfl:  .  atorvastatin (LIPITOR) 20 MG tablet, TAKE ONE TABLET BY MOUTH ONCE DAILY FOR CHOLESTEROL, Disp: 90 tablet, Rfl: 2 .  cyclobenzaprine (FLEXERIL) 10 MG tablet, Take 1 tablet (10 mg total) by mouth 3 (three) times daily as needed for muscle spasms., Disp: 30 tablet, Rfl: 2 .  ibuprofen (ADVIL) 800 MG tablet, Take 800 mg by mouth every 8 (eight) hours as needed., Disp: , Rfl:  .  metFORMIN (GLUCOPHAGE) 500 MG tablet, Take 1 tablet (500 mg total) by mouth 3 (three) times daily., Disp: 270 tablet, Rfl: 1 .  tamsulosin (FLOMAX) 0.4 MG CAPS capsule, Take 1 capsule (0.4 mg total) by mouth daily after supper., Disp: 90 capsule, Rfl: 2   Allergies  Allergen Reactions  . Sulfa Antibiotics Other (See Comments)    Ulcers in mouth   Past Medical History:  Diagnosis Date  . Diabetes mellitus without complication ([place])   . Hyperlipidemia   . Hypertension     Past Surgical History:  Procedure Laterality Date  . BACK SURGERY     x6  . NECK SURGERY    . SPINAL CORD STIMULATOR IMPLANT      Social History   Socioeconomic History  . Marital status: Divorced    Spouse name: Not on file  . Number of children: Not on file  . Years of education: Not on file  . Highest education level: Not on file  Occupational History  . Not on file  Tobacco Use  . Smoking status: Former Smoker    Types: Cigarettes    Quit date: 10/28/1999    Years since quitting: 20.2  . Smokeless tobacco: Never Used  Substance and Sexual Activity  . Alcohol use: Not  Currently  . Drug use: Never  . Sexual activity: Not on file  Other Topics Concern  . Not on file  Social History Narrative  . Not on file   Social Determinants of Health   Financial Resource Strain:   . Difficulty of Paying Living Expenses:   Food Insecurity:   . Worried About Charity fundraiser in the Last Year:   . Arboriculturist in the Last Year:   Transportation Needs:   . Film/video editor (Medical):   Marland Kitchen Lack of Transportation (Non-Medical):   Physical Activity:   . Days of Exercise per Week:   . Minutes of Exercise per Session:   Stress:   . Feeling of Stress :   Social Connections:   . Frequency of Communication with Friends and Family:   . Frequency of Social Gatherings with Friends and Family:   . Attends Religious Services:   . Active Member of Clubs or Organizations:   . Attends Archivist Meetings:   Marland Kitchen Marital Status:   Intimate Partner Violence:   . Fear of Current or Ex-Partner:   . Emotionally Abused:   Marland Kitchen Physically Abused:   . Sexually Abused:         Objective:    BP (!) 158/89   Pulse (!) 129   Temp 98.6 F (37 C) (Temporal)   Ht 6' 1"  (1.854 m)   Wt 233 lb 9.6 oz (106 kg)   SpO2 96%   BMI 30.82 kg/m   Wt Readings from Last 3 Encounters:  01/26/20 233 lb 9.6 oz (106 kg)  10/28/19 239 lb 3.2 oz (108.5 kg)    Physical Exam Vitals reviewed.  Constitutional:      General: He is not in acute distress.    Appearance: Normal appearance. He is obese. He is not ill-appearing, toxic-appearing or diaphoretic.  HENT:     Head: Normocephalic and atraumatic.  Eyes:     General: No scleral icterus.       Right eye: No discharge.        Left eye: No discharge.     Conjunctiva/sclera: Conjunctivae normal.  Cardiovascular:     Rate and Rhythm: Normal rate and regular rhythm.     Heart sounds: Normal heart sounds. No murmur. No friction rub. No gallop.   Pulmonary:     Effort: Pulmonary effort is normal. No respiratory distress.       Breath sounds: Normal breath sounds. No stridor. No wheezing, rhonchi or rales.  Musculoskeletal:        General: Normal range of motion.     Cervical back: Normal range of motion.  Skin:    General: Skin is warm and dry.  Neurological:     Mental Status: He  is alert and oriented to person, place, and time. Mental status is at baseline.  Psychiatric:        Mood and Affect: Mood normal.        Behavior: Behavior normal.        Thought Content: Thought content normal.        Judgment: Judgment normal.     No results found for: TSH Lab Results  Component Value Date   WBC 6.2 10/28/2019   HGB 16.4 10/28/2019   HCT 46.7 10/28/2019   MCV 87 10/28/2019   PLT 278 10/28/2019   Lab Results  Component Value Date   NA 138 10/28/2019   K 4.3 10/28/2019   CO2 20 10/28/2019   GLUCOSE 107 (H) 10/28/2019   BUN 17 10/28/2019   CREATININE 0.73 (L) 10/28/2019   BILITOT 0.4 10/28/2019   ALKPHOS 65 10/28/2019   AST 19 10/28/2019   ALT 17 10/28/2019   PROT 7.0 10/28/2019   ALBUMIN 4.6 10/28/2019   CALCIUM 8.8 10/28/2019   Lab Results  Component Value Date   CHOL 147 10/28/2019   Lab Results  Component Value Date   HDL 44 10/28/2019   Lab Results  Component Value Date   LDLCALC 84 10/28/2019   Lab Results  Component Value Date   TRIG 106 10/28/2019   Lab Results  Component Value Date   CHOLHDL 3.3 10/28/2019   Lab Results  Component Value Date   HGBA1C 6.8 01/26/2020

## 2020-01-26 ENCOUNTER — Other Ambulatory Visit: Payer: Self-pay

## 2020-01-26 ENCOUNTER — Encounter: Payer: Self-pay | Admitting: Family Medicine

## 2020-01-26 ENCOUNTER — Ambulatory Visit (INDEPENDENT_AMBULATORY_CARE_PROVIDER_SITE_OTHER): Payer: Medicare HMO | Admitting: Family Medicine

## 2020-01-26 VITALS — BP 158/89 | HR 129 | Temp 98.6°F | Ht 73.0 in | Wt 233.6 lb

## 2020-01-26 DIAGNOSIS — E785 Hyperlipidemia, unspecified: Secondary | ICD-10-CM

## 2020-01-26 DIAGNOSIS — E1169 Type 2 diabetes mellitus with other specified complication: Secondary | ICD-10-CM

## 2020-01-26 DIAGNOSIS — E1159 Type 2 diabetes mellitus with other circulatory complications: Secondary | ICD-10-CM

## 2020-01-26 DIAGNOSIS — I10 Essential (primary) hypertension: Secondary | ICD-10-CM | POA: Diagnosis not present

## 2020-01-26 LAB — BAYER DCA HB A1C WAIVED: HB A1C (BAYER DCA - WAIVED): 6.8 % (ref <7.0)

## 2020-01-26 MED ORDER — AMLODIPINE BESYLATE 10 MG PO TABS: 10.0000 mg | ORAL_TABLET | Freq: Every day | ORAL | 1 refills | Status: DC

## 2020-01-27 LAB — CMP14+EGFR
ALT: 16 IU/L (ref 0–44)
AST: 13 IU/L (ref 0–40)
Albumin/Globulin Ratio: 1.7 (ref 1.2–2.2)
Albumin: 4.3 g/dL (ref 3.8–4.8)
Alkaline Phosphatase: 59 IU/L (ref 39–117)
BUN/Creatinine Ratio: 18 (ref 10–24)
BUN: 14 mg/dL (ref 8–27)
Bilirubin Total: 0.5 mg/dL (ref 0.0–1.2)
CO2: 23 mmol/L (ref 20–29)
Calcium: 9.2 mg/dL (ref 8.6–10.2)
Chloride: 105 mmol/L (ref 96–106)
Creatinine, Ser: 0.79 mg/dL (ref 0.76–1.27)
GFR calc Af Amer: 107 mL/min/{1.73_m2} (ref >59)
GFR calc non Af Amer: 92 mL/min/{1.73_m2} (ref >59)
Globulin, Total: 2.5 g/dL (ref 1.5–4.5)
Glucose: 162 mg/dL — ABNORMAL HIGH (ref 65–99)
Potassium: 4.4 mmol/L (ref 3.5–5.2)
Sodium: 143 mmol/L (ref 134–144)
Total Protein: 6.8 g/dL (ref 6.0–8.5)

## 2020-01-28 ENCOUNTER — Encounter: Payer: Self-pay | Admitting: Family Medicine

## 2020-03-02 ENCOUNTER — Ambulatory Visit (INDEPENDENT_AMBULATORY_CARE_PROVIDER_SITE_OTHER): Payer: Medicare HMO

## 2020-03-02 DIAGNOSIS — Z0000 Encounter for general adult medical examination without abnormal findings: Secondary | ICD-10-CM

## 2020-03-02 NOTE — Progress Notes (Signed)
MEDICARE ANNUAL WELLNESS VISIT  03/02/2020  Telephone Visit Disclaimer This Medicare AWV was conducted by telephone due to national recommendations for restrictions regarding the COVID-19 Pandemic (e.g. social distancing).  I verified, using two identifiers, that I am speaking with Anthony Espinoza or their authorized healthcare agent. I discussed the limitations, risks, security, and privacy concerns of performing an evaluation and management service by telephone and the potential availability of an in-person appointment in the future. The patient expressed understanding and agreed to proceed.   Subjective:  Anthony Espinoza is a 69 y.o. male patient of Anthony Brooklyn, [name] who had a Medicare Annual Wellness Visit today via telephone. Anthony Espinoza is Retired and lives alone. he has one child. he reports that he is socially active and does interact with friends/family regularly. he is minimally physically active and enjoys hunting.  Patient Care Team: Anthony Brooklyn, [name] as PCP - General (Family Medicine)  Advanced Directives 03/02/2020  Does Patient Have a Medical Advance Directive? Yes  Type of Advance Directive Living will  Does patient want to make changes to medical advance directive? No - Patient declined  Would patient like information on creating a medical advance directive? No - Patient declined    Hospital Utilization Over the Past 12 Months: # of hospitalizations or ER visits: 0 # of surgeries: 0  Review of Systems    Patient reports that his overall health is better compared to last year.  Patient Reported Readings (BP, Pulse, CBG, Weight, etc) none  Pain Assessment Pain : No/denies pain  Current Medications & Allergies (verified) Allergies as of 03/02/2020      Reactions   Sulfa Antibiotics Other (See Comments)   Ulcers in mouth      Medication List       Accurate as of March 02, 2020 10:47 AM. If you have any questions, ask your nurse or doctor.          amLODipine 10 MG tablet Commonly known as: NORVASC Take 1 tablet (10 mg total) by mouth daily.   aspirin 81 MG chewable tablet Chew 81 mg by mouth daily.   atorvastatin 20 MG tablet Commonly known as: LIPITOR TAKE ONE TABLET BY MOUTH ONCE DAILY FOR CHOLESTEROL   cyclobenzaprine 10 MG tablet Commonly known as: FLEXERIL Take 1 tablet (10 mg total) by mouth 3 (three) times daily as needed for muscle spasms.   ibuprofen 800 MG tablet Commonly known as: ADVIL Take 800 mg by mouth every 8 (eight) hours as needed.   metFORMIN 500 MG tablet Commonly known as: GLUCOPHAGE Take 1 tablet (500 mg total) by mouth 3 (three) times daily.   tamsulosin 0.4 MG Caps capsule Commonly known as: FLOMAX Take 1 capsule (0.4 mg total) by mouth daily after supper.       History (reviewed): Past Medical History:  Diagnosis Date  . Diabetes mellitus without complication ([place])   . Hyperlipidemia   . Hypertension    Past Surgical History:  Procedure Laterality Date  . BACK SURGERY     x6  . NECK SURGERY    . SPINAL CORD STIMULATOR IMPLANT     Family History  Problem Relation Age of Onset  . Diabetes Mother   . Parkinson's disease Brother    Social History   Socioeconomic History  . Marital status: Divorced    Spouse name: Not on file  . Number of children: Not on file  . Years of education: Not on file  .  Highest education level: Not on file  Occupational History  . Not on file  Tobacco Use  . Smoking status: Former Smoker    Types: Cigarettes    Quit date: 10/28/1999    Years since quitting: 20.3  . Smokeless tobacco: Never Used  Substance and Sexual Activity  . Alcohol use: Not Currently  . Drug use: Never  . Sexual activity: Not on file  Other Topics Concern  . Not on file  Social History Narrative  . Not on file   Social Determinants of Health   Financial Resource Strain:   . Difficulty of Paying Living Expenses:   Food Insecurity:   . Worried About Ship broker in the Last Year:   . Arboriculturist in the Last Year:   Transportation Needs:   . Film/video editor (Medical):   Marland Kitchen Lack of Transportation (Non-Medical):   Physical Activity:   . Days of Exercise per Week:   . Minutes of Exercise per Session:   Stress:   . Feeling of Stress :   Social Connections:   . Frequency of Communication with Friends and Family:   . Frequency of Social Gatherings with Friends and Family:   . Attends Religious Services:   . Active Member of Clubs or Organizations:   . Attends Archivist Meetings:   Marland Kitchen Marital Status:     Activities of Daily Living In your present state of health, do you have any difficulty performing the following activities: 03/02/2020  Hearing? N  Vision? N  Difficulty concentrating or making decisions? N  Walking or climbing stairs? N  Dressing or bathing? N  Doing errands, shopping? N  Preparing Food and eating ? N  Using the Toilet? N  In the past six months, have you accidently leaked urine? N  Do you have problems with loss of bowel control? N  Managing your Medications? N  Managing your Finances? N  Housekeeping or managing your Housekeeping? N  Some recent data might be hidden    Patient Education/ Literacy How often do you need to have someone help you when you read instructions, pamphlets, or other written materials from your doctor or pharmacy?: 1 - Never  Exercise Current Exercise Habits: Home exercise routine, Type of exercise: walking, Time (Minutes): 30, Frequency (Times/Week): 3, Weekly Exercise (Minutes/Week): 90, Intensity: Mild, Exercise limited by: orthopedic condition(s)  Diet Patient reports consuming 2 meals a day and 2 snack(s) a day Patient reports that his primary diet is: Low Sodium Patient reports that she does have regular access to food.   Depression Screen PHQ 2/9 Scores 01/26/2020 10/28/2019  PHQ - 2 Score 0 0     Fall Risk Fall Risk  01/26/2020 10/28/2019  Falls in the  past year? 0 0     Objective:  Anthony Espinoza seemed alert and oriented and he participated appropriately during our telephone visit.  Blood Pressure Weight BMI  BP Readings from Last 3 Encounters:  01/26/20 (!) 158/89  10/28/19 (!) 163/93  05/03/17 140/86   Wt Readings from Last 3 Encounters:  01/26/20 233 lb 9.6 oz (106 kg)  10/28/19 239 lb 3.2 oz (108.5 kg)   BMI Readings from Last 1 Encounters:  01/26/20 30.82 kg/m    *Unable to obtain current vital signs, weight, and BMI due to telephone visit type  Hearing/Vision  . Anthony Espinoza did not seem to have difficulty with hearing/understanding during the telephone conversation . Reports that he has had  a formal eye exam by an eye care professional within the past year . Reports that he has not had a formal hearing evaluation within the past year *Unable to fully assess hearing and vision during telephone visit type  Cognitive Function: 6CIT Screen 03/02/2020  What Year? 0 points  What month? 0 points  What time? 0 points  Count back from 20 0 points  Months in reverse 0 points  Repeat phrase 4 points  Total Score 4   (Normal:0-7, Significant for Dysfunction: >8)  Normal Cognitive Function Screening: Yes   Immunization & Health Maintenance Record Immunization History  Administered Date(s) Administered  . Influenza, High Dose Seasonal PF 08/15/2016, 09/06/2017, 09/18/2018  . Influenza, Quadrivalent, Recombinant, Inj, Pf 08/25/2019  . Influenza, Seasonal, Injecte, Preservative Fre 09/30/2015  . Influenza,trivalent, recombinat, inj, PF 08/26/2014  . Pneumococcal Conjugate-13 06/14/2016, 08/25/2019  . Pneumococcal Polysaccharide-23 09/06/2017    Health Maintenance  Topic Date Due  . COVID-19 Vaccine (1) Never done  . COLONOSCOPY  10/27/2020 (Originally 12/06/2009)  . TETANUS/TDAP  10/27/2020 (Originally 01/29/1970)  . INFLUENZA VACCINE  06/12/2020  . HEMOGLOBIN A1C  07/28/2020  . FOOT EXAM  10/27/2020  . URINE  MICROALBUMIN  10/27/2020  . OPHTHALMOLOGY EXAM  12/29/2020  . Hepatitis C Screening  Completed  . PNA vac Low Risk Adult  Completed       Assessment  This is a routine wellness examination for Anthony Espinoza.  Health Maintenance: Due or Overdue Health Maintenance Due  Topic Date Due  . COVID-19 Vaccine (1) Never done    Anthony Espinoza does not need a referral for Community Assistance: Care Management:   no Social Work:    no Prescription Assistance:  no Nutrition/Diabetes Education:  no   Plan:  Personalized Goals Goals Addressed            This Visit's Progress   . DIET - EAT MORE FRUITS AND VEGETABLES      . Exercise 150 min/wk Moderate Activity        Personalized Health Maintenance & Screening Recommendations   Lung Cancer Screening Recommended: no (Low Dose CT Chest recommended if Age 69-80 years, 30 pack-year currently smoking OR have quit w/in past 15 years) Hepatitis C Screening recommended: no HIV Screening recommended: no  Advanced Directives: Written information was not prepared per patient's request.  Referrals & Orders No orders of the defined types were placed in this encounter.   Follow-up Plan . Follow-up with Anthony Brooklyn, [name] as planned . Schedule 07/28/2020    I have personally reviewed and noted the following in the patient's chart:   . Medical and social history . Use of alcohol, tobacco or illicit drugs  . Current medications and supplements . Functional ability and status . Nutritional status . Physical activity . Advanced directives . List of other physicians . Hospitalizations, surgeries, and ER visits in previous 12 months . Vitals . Screenings to include cognitive, depression, and falls . Referrals and appointments  In addition, I have reviewed and discussed with Anthony Espinoza certain preventive protocols, quality metrics, and best practice recommendations. A written personalized care plan for preventive services as  well as general preventive health recommendations is available and can be mailed to the patient at his request.      Maud Deed [facility]  [phone]

## 2020-03-02 NOTE — Patient Instructions (Signed)
  [place] Maintenance Summary and Written Plan of Care  Mr. Anthony Espinoza ,  Thank you for allowing me to perform your Medicare Annual Wellness Visit and for your ongoing commitment to your health.   Health Maintenance & Immunization History Health Maintenance  Topic Date Due  . COVID-19 Vaccine (1) Never done  . COLONOSCOPY  10/27/2020 (Originally 12/06/2009)  . TETANUS/TDAP  10/27/2020 (Originally 01/29/1970)  . INFLUENZA VACCINE  06/12/2020  . HEMOGLOBIN A1C  07/28/2020  . FOOT EXAM  10/27/2020  . URINE MICROALBUMIN  10/27/2020  . OPHTHALMOLOGY EXAM  12/29/2020  . Hepatitis C Screening  Completed  . PNA vac Low Risk Adult  Completed   Immunization History  Administered Date(s) Administered  . Influenza, High Dose Seasonal PF 08/15/2016, 09/06/2017, 09/18/2018  . Influenza, Quadrivalent, Recombinant, Inj, Pf 08/25/2019  . Influenza, Seasonal, Injecte, Preservative Fre 09/30/2015  . Influenza,trivalent, recombinat, inj, PF 08/26/2014  . Pneumococcal Conjugate-13 06/14/2016, 08/25/2019  . Pneumococcal Polysaccharide-23 09/06/2017    These are the patient goals that we discussed: Goals Addressed            This Visit's Progress   . DIET - EAT MORE FRUITS AND VEGETABLES      . Exercise 150 min/wk Moderate Activity          This is a list of Health Maintenance Items that are overdue or due now: Health Maintenance Due  Topic Date Due  . COVID-19 Vaccine (1) Never done     Orders/Referrals Placed Today: No orders of the defined types were placed in this encounter.  (Contact our referral department at [phone] if you have not spoken with someone about your referral appointment within the next 5 days)    Follow-up Plan  Appointment scheduled with Hendricks Limes, [name] on 07/28/2020

## 2020-04-19 ENCOUNTER — Other Ambulatory Visit: Payer: Self-pay | Admitting: Family Medicine

## 2020-04-19 DIAGNOSIS — M26609 Unspecified temporomandibular joint disorder, unspecified side: Secondary | ICD-10-CM

## 2020-07-19 ENCOUNTER — Other Ambulatory Visit: Payer: Self-pay | Admitting: *Deleted

## 2020-07-20 MED ORDER — GLIPIZIDE ER 10 MG PO TB24: 10.0000 mg | ORAL_TABLET | Freq: Every day | ORAL | 0 refills | Status: DC

## 2020-07-25 ENCOUNTER — Other Ambulatory Visit: Payer: Self-pay | Admitting: Family Medicine

## 2020-07-25 DIAGNOSIS — E1169 Type 2 diabetes mellitus with other specified complication: Secondary | ICD-10-CM

## 2020-07-28 ENCOUNTER — Other Ambulatory Visit: Payer: Self-pay

## 2020-07-28 ENCOUNTER — Encounter: Payer: Self-pay | Admitting: Family Medicine

## 2020-07-28 ENCOUNTER — Ambulatory Visit (INDEPENDENT_AMBULATORY_CARE_PROVIDER_SITE_OTHER): Payer: Medicare HMO | Admitting: Family Medicine

## 2020-07-28 VITALS — BP 135/80 | HR 120 | Temp 98.3°F | Ht 73.0 in | Wt 232.6 lb

## 2020-07-28 DIAGNOSIS — E1159 Type 2 diabetes mellitus with other circulatory complications: Secondary | ICD-10-CM | POA: Diagnosis not present

## 2020-07-28 DIAGNOSIS — I10 Essential (primary) hypertension: Secondary | ICD-10-CM

## 2020-07-28 DIAGNOSIS — Z1211 Encounter for screening for malignant neoplasm of colon: Secondary | ICD-10-CM | POA: Diagnosis not present

## 2020-07-28 DIAGNOSIS — E785 Hyperlipidemia, unspecified: Secondary | ICD-10-CM | POA: Diagnosis not present

## 2020-07-28 DIAGNOSIS — R000 Tachycardia, unspecified: Secondary | ICD-10-CM

## 2020-07-28 DIAGNOSIS — E1169 Type 2 diabetes mellitus with other specified complication: Secondary | ICD-10-CM

## 2020-07-28 LAB — CMP14+EGFR
ALT: 24 IU/L (ref 0–44)
AST: 19 IU/L (ref 0–40)
Albumin/Globulin Ratio: 1.8 (ref 1.2–2.2)
Albumin: 4.6 g/dL (ref 3.8–4.8)
Alkaline Phosphatase: 64 IU/L (ref 44–121)
BUN/Creatinine Ratio: 16 (ref 10–24)
BUN: 13 mg/dL (ref 8–27)
Bilirubin Total: 0.6 mg/dL (ref 0.0–1.2)
CO2: 21 mmol/L (ref 20–29)
Calcium: 9.4 mg/dL (ref 8.6–10.2)
Chloride: 100 mmol/L (ref 96–106)
Creatinine, Ser: 0.79 mg/dL (ref 0.76–1.27)
GFR calc Af Amer: 106 mL/min/{1.73_m2} (ref >59)
GFR calc non Af Amer: 92 mL/min/{1.73_m2} (ref >59)
Globulin, Total: 2.6 g/dL (ref 1.5–4.5)
Glucose: 162 mg/dL — ABNORMAL HIGH (ref 65–99)
Potassium: 4.7 mmol/L (ref 3.5–5.2)
Sodium: 138 mmol/L (ref 134–144)
Total Protein: 7.2 g/dL (ref 6.0–8.5)

## 2020-07-28 LAB — BAYER DCA HB A1C WAIVED: HB A1C (BAYER DCA - WAIVED): 7.3 % — ABNORMAL HIGH (ref <7.0)

## 2020-07-28 MED ORDER — METFORMIN HCL 500 MG PO TABS: 1000.0000 mg | ORAL_TABLET | Freq: Two times a day (BID) | ORAL | 1 refills | Status: DC

## 2020-07-28 MED ORDER — LISINOPRIL 5 MG PO TABS
5.0000 mg | ORAL_TABLET | Freq: Every day | ORAL | 2 refills | Status: DC
Start: 2020-07-28 — End: 2020-08-25

## 2020-07-28 NOTE — Patient Instructions (Signed)

## 2020-07-28 NOTE — Progress Notes (Signed)
Assessment & Plan:  1. DM type 2 with diabetic dyslipidemia (HCC) Lab Results  Component Value Date   HGBA1C 7.3 (H) 07/28/2020   HGBA1C 6.8 01/26/2020   HGBA1C 6.9 10/28/2019  - Diabetes is not at goal of A1c < 7. - Medications: increase metformin from 1,500 mg per day to 2,000 mg per day - 1,000 mg BID - Home glucose monitoring: does not check - Patient is currently taking a statin. Patient is taking an ACE-inhibitor/ARB.  - Last foot exam: 10/28/2019 - Last diabetic eye exam: 12/30/2019 - Urine Microalbumin/Creat Ratio: 10/28/2019 - Instruction/counseling given: discussed diet and provided printed educational material - Bayer DCA Hb A1c Waived - lisinopril (ZESTRIL) 5 MG tablet; Take 1 tablet (5 mg total) by mouth daily.  Dispense: 30 tablet; Refill: 2 - metFORMIN (GLUCOPHAGE) 500 MG tablet; Take 2 tablets (1,000 mg total) by mouth 2 (two) times daily with a meal.  Dispense: 360 tablet; Refill: 1  2. Hypertension associated with diabetes ([place]) - Well controlled on current regimen.  - CMP14+EGFR - lisinopril (ZESTRIL) 5 MG tablet; Take 1 tablet (5 mg total) by mouth daily.  Dispense: 30 tablet; Refill: 2  3. Tachycardia - Patient cannot feel his heart going fast. His rhythm is normal. Started Lisinopril today for DM, will see if this helps lower HR.   4. Colon cancer screening - Cologuard   Return in about 4 weeks (around 08/25/2020) for re-check HR.  Anthony Limes, MSN, APRN, [name]-C [facility]  Subjective:    Patient ID: Anthony Espinoza, male    DOB: [birth year], 69 y.o.   MRN: [id number]  Patient Care Team: [name], [name] as PCP - General (Family Medicine)   Chief Complaint:  Chief Complaint  Patient presents with  . Diabetes    41mo  . Hypertension    HPI: Anthony Espinoza a 69y.o. male presenting on 07/28/2020 for Diabetes (684mo and Hypertension  Diabetes: Patient presents for follow up of diabetes. Current symptoms include:  none.  Known diabetic complications: none. Medication compliance: yes. Current diet: in general, a "healthy" diet - has cut out soda and bread. Current exercise: walking. Home blood sugar records: patient does not check sugars. Is he  on ACE inhibitor or angiotensin II receptor blocker? No. Is he on a statin? Yes.   Lab Results  Component Value Date   HGBA1C 7.3 (H) 07/28/2020   HGBA1C 6.8 01/26/2020   HGBA1C 6.9 10/28/2019   Lab Results  Component Value Date   LDLCALC 84 10/28/2019   CREATININE 0.79 01/26/2020     New complaints: None  Social history:  Relevant past medical, surgical, family and social history reviewed and updated as indicated. Interim medical history since our last visit reviewed.  Allergies and medications reviewed and updated.  DATA REVIEWED: CHART IN EPIC  ROS: Negative unless specifically indicated above in HPI.    Current Outpatient Medications:  .  amLODipine (NORVASC) 10 MG tablet, Take 1 tablet (10 mg total) by mouth daily., Disp: 90 tablet, Rfl: 1 .  aspirin 81 MG chewable tablet, Chew 81 mg by mouth daily., Disp: , Rfl:  .  atorvastatin (LIPITOR) 20 MG tablet, TAKE 1 TABLET BY MOUTH ONCE DAILY FOR CHOLESTEROL, Disp: 90 tablet, Rfl: 0 .  cyclobenzaprine (FLEXERIL) 10 MG tablet, TAKE 1 TABLET BY MOUTH THREE TIMES DAILY AS NEEDED FOR MUSCLE SPASMS, Disp: 30 tablet, Rfl: 5 .  glipiZIDE (GLUCOTROL XL) 10 MG 24 hr tablet, Take 1  tablet (10 mg total) by mouth daily., Disp: 90 tablet, Rfl: 0 .  ibuprofen (ADVIL) 800 MG tablet, Take 800 mg by mouth every 8 (eight) hours as needed., Disp: , Rfl:  .  metFORMIN (GLUCOPHAGE) 500 MG tablet, Take 1 tablet (500 mg total) by mouth 3 (three) times daily., Disp: 270 tablet, Rfl: 1 .  tamsulosin (FLOMAX) 0.4 MG CAPS capsule, Take 1 capsule (0.4 mg total) by mouth daily after supper., Disp: 90 capsule, Rfl: 2   Allergies  Allergen Reactions  . Sulfa Antibiotics Other (See Comments)    Ulcers in mouth   Past Medical  History:  Diagnosis Date  . Diabetes mellitus without complication ([place])   . Hyperlipidemia   . Hypertension     Past Surgical History:  Procedure Laterality Date  . BACK SURGERY     x6  . NECK SURGERY    . SPINAL CORD STIMULATOR IMPLANT      Social History   Socioeconomic History  . Marital status: Divorced    Spouse name: Not on file  . Number of children: Not on file  . Years of education: Not on file  . Highest education level: Not on file  Occupational History  . Not on file  Tobacco Use  . Smoking status: Former Smoker    Types: Cigarettes    Quit date: 10/28/1999    Years since quitting: 20.7  . Smokeless tobacco: Never Used  Vaping Use  . Vaping Use: Never used  Substance and Sexual Activity  . Alcohol use: Not Currently  . Drug use: Never  . Sexual activity: Not on file  Other Topics Concern  . Not on file  Social History Narrative  . Not on file   Social Determinants of Health   Financial Resource Strain:   . Difficulty of Paying Living Expenses: Not on file  Food Insecurity:   . Worried About Charity fundraiser in the Last Year: Not on file  . Ran Out of Food in the Last Year: Not on file  Transportation Needs:   . Lack of Transportation (Medical): Not on file  . Lack of Transportation (Non-Medical): Not on file  Physical Activity:   . Days of Exercise per Week: Not on file  . Minutes of Exercise per Session: Not on file  Stress:   . Feeling of Stress : Not on file  Social Connections:   . Frequency of Communication with Friends and Family: Not on file  . Frequency of Social Gatherings with Friends and Family: Not on file  . Attends Religious Services: Not on file  . Active Member of Clubs or Organizations: Not on file  . Attends Archivist Meetings: Not on file  . Marital Status: Not on file  Intimate Partner Violence:   . Fear of Current or Ex-Partner: Not on file  . Emotionally Abused: Not on file  . Physically Abused: Not on  file  . Sexually Abused: Not on file        Objective:    BP 135/80   Pulse (!) 120   Temp 98.3 F (36.8 C) (Temporal)   Ht 6' 1"  (1.854 m)   Wt 232 lb 9.6 oz (105.5 kg)   BMI 30.69 kg/m   Wt Readings from Last 3 Encounters:  07/28/20 232 lb 9.6 oz (105.5 kg)  01/26/20 233 lb 9.6 oz (106 kg)  10/28/19 239 lb 3.2 oz (108.5 kg)    Physical Exam Vitals reviewed.  Constitutional:  General: He is not in acute distress.    Appearance: Normal appearance. He is obese. He is not ill-appearing, toxic-appearing or diaphoretic.  HENT:     Head: Normocephalic and atraumatic.  Eyes:     General: No scleral icterus.       Right eye: No discharge.        Left eye: No discharge.     Conjunctiva/sclera: Conjunctivae normal.  Cardiovascular:     Rate and Rhythm: Regular rhythm. Tachycardia present.     Heart sounds: Normal heart sounds. No murmur heard.  No friction rub. No gallop.   Pulmonary:     Effort: Pulmonary effort is normal. No respiratory distress.     Breath sounds: Normal breath sounds. No stridor. No wheezing, rhonchi or rales.  Musculoskeletal:        General: Normal range of motion.     Cervical back: Normal range of motion.  Skin:    General: Skin is warm and dry.  Neurological:     Mental Status: He is alert and oriented to person, place, and time. Mental status is at baseline.  Psychiatric:        Mood and Affect: Mood normal.        Behavior: Behavior normal.        Thought Content: Thought content normal.        Judgment: Judgment normal.     No results found for: TSH Lab Results  Component Value Date   WBC 6.2 10/28/2019   HGB 16.4 10/28/2019   HCT 46.7 10/28/2019   MCV 87 10/28/2019   PLT 278 10/28/2019   Lab Results  Component Value Date   NA 143 01/26/2020   K 4.4 01/26/2020   CO2 23 01/26/2020   GLUCOSE 162 (H) 01/26/2020   BUN 14 01/26/2020   CREATININE 0.79 01/26/2020   BILITOT 0.5 01/26/2020   ALKPHOS 59 01/26/2020   AST 13  01/26/2020   ALT 16 01/26/2020   PROT 6.8 01/26/2020   ALBUMIN 4.3 01/26/2020   CALCIUM 9.2 01/26/2020   Lab Results  Component Value Date   CHOL 147 10/28/2019   Lab Results  Component Value Date   HDL 44 10/28/2019   Lab Results  Component Value Date   LDLCALC 84 10/28/2019   Lab Results  Component Value Date   TRIG 106 10/28/2019   Lab Results  Component Value Date   CHOLHDL 3.3 10/28/2019   Lab Results  Component Value Date   HGBA1C 6.8 01/26/2020

## 2020-07-29 ENCOUNTER — Telehealth: Payer: Self-pay | Admitting: Family Medicine

## 2020-07-29 NOTE — Telephone Encounter (Signed)
Patient aware of labs. Verbalized understanding 

## 2020-08-25 ENCOUNTER — Encounter: Payer: Self-pay | Admitting: Family Medicine

## 2020-08-25 ENCOUNTER — Other Ambulatory Visit: Payer: Self-pay

## 2020-08-25 ENCOUNTER — Ambulatory Visit (INDEPENDENT_AMBULATORY_CARE_PROVIDER_SITE_OTHER): Payer: Medicare HMO | Admitting: Family Medicine

## 2020-08-25 VITALS — BP 126/89 | HR 126 | Temp 97.6°F | Ht 73.0 in | Wt 228.8 lb

## 2020-08-25 DIAGNOSIS — E1159 Type 2 diabetes mellitus with other circulatory complications: Secondary | ICD-10-CM | POA: Diagnosis not present

## 2020-08-25 DIAGNOSIS — I152 Hypertension secondary to endocrine disorders: Secondary | ICD-10-CM

## 2020-08-25 DIAGNOSIS — E1169 Type 2 diabetes mellitus with other specified complication: Secondary | ICD-10-CM

## 2020-08-25 DIAGNOSIS — R000 Tachycardia, unspecified: Secondary | ICD-10-CM | POA: Diagnosis not present

## 2020-08-25 DIAGNOSIS — Z23 Encounter for immunization: Secondary | ICD-10-CM

## 2020-08-25 MED ORDER — METOPROLOL SUCCINATE ER 25 MG PO TB24: 12.5000 mg | ORAL_TABLET | Freq: Every day | ORAL | 2 refills | Status: DC

## 2020-08-25 MED ORDER — LISINOPRIL 2.5 MG PO TABS: 2.5000 mg | ORAL_TABLET | Freq: Every day | ORAL | 2 refills | Status: DC

## 2020-08-25 NOTE — Progress Notes (Signed)
Assessment & Plan:  1. Tachycardia - Started on metoprolol today. - metoprolol succinate (TOPROL XL) 25 MG 24 hr tablet; Take 0.5 tablets (12.5 mg total) by mouth daily.  Dispense: 15 tablet; Refill: 2  2. Hypertension associated with diabetes ([place]) - Lisinopril decreased from 5 mg to 2.5 mg due to dizziness. Encouraged patient to take metformin with food - lisinopril (ZESTRIL) 2.5 MG tablet; Take 1 tablet (2.5 mg total) by mouth daily.  Dispense: 30 tablet; Refill: 2  4. Need for immunization against influenza - Flu Vaccine QUAD High Dose(Fluad)   Return in about 3 weeks (around 09/15/2020) for HR.  Hendricks Limes, MSN, APRN, [name]-C [facility]  Subjective:    Patient ID: Anthony Espinoza, male    DOB: Apr 21, 1951, 69 y.o.   MRN: [id number]  Patient Care Team: [name], [name] as PCP - General (Family Medicine)   Chief Complaint:  Chief Complaint  Patient presents with  . 4 week follow up- HR    HPI: Anthony Espinoza is a 69 y.o. male presenting on 08/25/2020 for 4 week follow up- HR  Patient is here for a follow-up of tachycardia. He was started on Lisinopril 5 mg at night last visit due to diabetes and also to lower his HR. He reports he has been having episodes of dizziness and diarrhea since starting the medication. His metformin was also increased at the same time for better control of his diabetes. He does not check his BP/HR at home. He does take metformin with food.   New complaints: None  Social history:  Relevant past medical, surgical, family and social history reviewed and updated as indicated. Interim medical history since our last visit reviewed.  Allergies and medications reviewed and updated.  DATA REVIEWED: CHART IN EPIC  ROS: Negative unless specifically indicated above in HPI.    Current Outpatient Medications:  .  amLODipine (NORVASC) 10 MG tablet, Take 1 tablet (10 mg total) by mouth daily., Disp: 90 tablet, Rfl: 1 .   aspirin 81 MG chewable tablet, Chew 81 mg by mouth daily., Disp: , Rfl:  .  atorvastatin (LIPITOR) 20 MG tablet, TAKE 1 TABLET BY MOUTH ONCE DAILY FOR CHOLESTEROL, Disp: 90 tablet, Rfl: 0 .  cyclobenzaprine (FLEXERIL) 10 MG tablet, TAKE 1 TABLET BY MOUTH THREE TIMES DAILY AS NEEDED FOR MUSCLE SPASMS, Disp: 30 tablet, Rfl: 5 .  glipiZIDE (GLUCOTROL XL) 10 MG 24 hr tablet, Take 1 tablet (10 mg total) by mouth daily., Disp: 90 tablet, Rfl: 0 .  ibuprofen (ADVIL) 800 MG tablet, Take 800 mg by mouth every 8 (eight) hours as needed., Disp: , Rfl:  .  lisinopril (ZESTRIL) 5 MG tablet, Take 1 tablet (5 mg total) by mouth daily., Disp: 30 tablet, Rfl: 2 .  metFORMIN (GLUCOPHAGE) 500 MG tablet, Take 2 tablets (1,000 mg total) by mouth 2 (two) times daily with a meal., Disp: 360 tablet, Rfl: 1 .  tamsulosin (FLOMAX) 0.4 MG CAPS capsule, Take 1 capsule (0.4 mg total) by mouth daily after supper., Disp: 90 capsule, Rfl: 2   Allergies  Allergen Reactions  . Sulfa Antibiotics Other (See Comments)    Ulcers in mouth   Past Medical History:  Diagnosis Date  . Diabetes mellitus without complication ([place])   . Hyperlipidemia   . Hypertension     Past Surgical History:  Procedure Laterality Date  . BACK SURGERY     x6  . NECK SURGERY    . SPINAL CORD  STIMULATOR IMPLANT      Social History   Socioeconomic History  . Marital status: Divorced    Spouse name: Not on file  . Number of children: Not on file  . Years of education: Not on file  . Highest education level: Not on file  Occupational History  . Not on file  Tobacco Use  . Smoking status: Former Smoker    Types: Cigarettes    Quit date: 10/28/1999    Years since quitting: 20.8  . Smokeless tobacco: Never Used  Vaping Use  . Vaping Use: Never used  Substance and Sexual Activity  . Alcohol use: Not Currently  . Drug use: Never  . Sexual activity: Not on file  Other Topics Concern  . Not on file  Social History Narrative  . Not on  file   Social Determinants of Health   Financial Resource Strain:   . Difficulty of Paying Living Expenses: Not on file  Food Insecurity:   . Worried About Charity fundraiser in the Last Year: Not on file  . Ran Out of Food in the Last Year: Not on file  Transportation Needs:   . Lack of Transportation (Medical): Not on file  . Lack of Transportation (Non-Medical): Not on file  Physical Activity:   . Days of Exercise per Week: Not on file  . Minutes of Exercise per Session: Not on file  Stress:   . Feeling of Stress : Not on file  Social Connections:   . Frequency of Communication with Friends and Family: Not on file  . Frequency of Social Gatherings with Friends and Family: Not on file  . Attends Religious Services: Not on file  . Active Member of Clubs or Organizations: Not on file  . Attends Archivist Meetings: Not on file  . Marital Status: Not on file  Intimate Partner Violence:   . Fear of Current or Ex-Partner: Not on file  . Emotionally Abused: Not on file  . Physically Abused: Not on file  . Sexually Abused: Not on file        Objective:    BP 126/89   Pulse (!) 126   Temp 97.6 F (36.4 C) (Temporal)   Ht 6\' 1"  (1.854 m)   Wt 228 lb 12.8 oz (103.8 kg)   SpO2 98%   BMI 30.19 kg/m   Wt Readings from Last 3 Encounters:  08/25/20 228 lb 12.8 oz (103.8 kg)  07/28/20 232 lb 9.6 oz (105.5 kg)  01/26/20 233 lb 9.6 oz (106 kg)    Physical Exam Vitals reviewed.  Constitutional:      General: He is not in acute distress.    Appearance: Normal appearance. He is obese. He is not ill-appearing, toxic-appearing or diaphoretic.  HENT:     Head: Normocephalic and atraumatic.  Eyes:     General: No scleral icterus.       Right eye: No discharge.        Left eye: No discharge.     Conjunctiva/sclera: Conjunctivae normal.  Cardiovascular:     Rate and Rhythm: Regular rhythm. Tachycardia present.     Heart sounds: Normal heart sounds. No murmur heard.    No friction rub. No gallop.   Pulmonary:     Effort: Pulmonary effort is normal. No respiratory distress.     Breath sounds: Normal breath sounds. No stridor. No wheezing, rhonchi or rales.  Musculoskeletal:        General: Normal range of  motion.     Cervical back: Normal range of motion.  Skin:    General: Skin is warm and dry.  Neurological:     Mental Status: He is alert and oriented to person, place, and time. Mental status is at baseline.  Psychiatric:        Mood and Affect: Mood normal.        Behavior: Behavior normal.        Thought Content: Thought content normal.        Judgment: Judgment normal.     No results found for: TSH Lab Results  Component Value Date   WBC 6.2 10/28/2019   HGB 16.4 10/28/2019   HCT 46.7 10/28/2019   MCV 87 10/28/2019   PLT 278 10/28/2019   Lab Results  Component Value Date   NA 138 07/28/2020   K 4.7 07/28/2020   CO2 21 07/28/2020   GLUCOSE 162 (H) 07/28/2020   BUN 13 07/28/2020   CREATININE 0.79 07/28/2020   BILITOT 0.6 07/28/2020   ALKPHOS 64 07/28/2020   AST 19 07/28/2020   ALT 24 07/28/2020   PROT 7.2 07/28/2020   ALBUMIN 4.6 07/28/2020   CALCIUM 9.4 07/28/2020   Lab Results  Component Value Date   CHOL 147 10/28/2019   Lab Results  Component Value Date   HDL 44 10/28/2019   Lab Results  Component Value Date   LDLCALC 84 10/28/2019   Lab Results  Component Value Date   TRIG 106 10/28/2019   Lab Results  Component Value Date   CHOLHDL 3.3 10/28/2019   Lab Results  Component Value Date   HGBA1C 7.3 (H) 07/28/2020

## 2020-08-25 NOTE — Patient Instructions (Signed)
Sinus Tachycardia  Sinus tachycardia is a kind of fast heartbeat. In sinus tachycardia, the heart beats more than 100 times a minute. Sinus tachycardia starts in a part of the heart called the sinus node. Sinus tachycardia may be harmless, or it may be a sign of a serious condition. What are the causes? This condition may be caused by:  Exercise or exertion.  A fever.  Pain.  Loss of body fluids (dehydration).  Severe bleeding (hemorrhage).  Anxiety and stress.  Certain substances, including: ? Alcohol. ? Caffeine. ? Tobacco and nicotine products. ? Cold medicines. ? Illegal drugs.  Medical conditions including: ? Heart disease. ? An infection. ? An overactive thyroid (hyperthyroidism). ? A lack of red blood cells (anemia). What are the signs or symptoms? Symptoms of this condition include:  A feeling that the heart is beating quickly (palpitations).  Suddenly noticing your heartbeat (cardiac awareness).  Dizziness.  Tiredness (fatigue).  Shortness of breath.  Chest pain.  Nausea.  Fainting. How is this diagnosed? This condition is diagnosed with:  A physical exam.  Other tests, such as: ? Blood tests. ? An electrocardiogram (ECG). This test measures the electrical activity of the heart. ? Ambulatory cardiac monitor. This records your heartbeats for 24 hours or more. You may be referred to a heart specialist (cardiologist). How is this treated? Treatment for this condition depends on the cause or the underlying condition. Treatment may involve:  Treating the underlying condition.  Taking new medicines or changing your current medicines as told by your health care provider.  Making changes to your diet or lifestyle. Follow these instructions at home: Lifestyle   Do not use any products that contain nicotine or tobacco, such as cigarettes and e-cigarettes. If you need help quitting, ask your health care provider.  Do not use illegal drugs, such as  cocaine.  Learn relaxation methods to help you when you get stressed or anxious. These include deep breathing.  Avoid caffeine or other stimulants. Alcohol use   Do not drink alcohol if: ? Your health care provider tells you not to drink. ? You are pregnant, may be pregnant, or are planning to become pregnant.  If you drink alcohol, limit how much you have: ? 0-1 drink a day for women. ? 0-2 drinks a day for men.  Be aware of how much alcohol is in your drink. In the [place] one drink equals one typical bottle of beer (12 oz), one-half glass of wine (5 oz), or one shot of hard liquor (1 oz). General instructions  Drink enough fluids to keep your urine pale yellow.  Take over-the-counter and prescription medicines only as told by your health care provider.  Keep all follow-up visits as told by your health care provider. This is important. Contact a health care provider if you have:  A fever.  Vomiting or diarrhea that does not go away. Get help right away if you:  Have pain in your chest, upper arms, jaw, or neck.  Become weak or dizzy.  Feel faint.  Have palpitations that do not go away. Summary  In sinus tachycardia, the heart beats more than 100 times a minute.  Sinus tachycardia may be harmless, or it may be a sign of a serious condition.  Treatment for this condition depends on the cause or the underlying condition.  Get help right away if you have pain in your chest, upper arms, jaw, or neck. This information is not intended to replace advice given to you by   your health care provider. Make sure you discuss any questions you have with your health care provider. Document Revised: 12/18/2017 Document Reviewed: 12/18/2017 Elsevier Patient Education  2020 Elsevier Inc.  

## 2020-08-26 ENCOUNTER — Encounter: Payer: Self-pay | Admitting: Family Medicine

## 2020-08-30 ENCOUNTER — Other Ambulatory Visit: Payer: Self-pay | Admitting: Family Medicine

## 2020-08-30 DIAGNOSIS — E1159 Type 2 diabetes mellitus with other circulatory complications: Secondary | ICD-10-CM

## 2020-09-15 ENCOUNTER — Encounter: Payer: Self-pay | Admitting: Family Medicine

## 2020-09-15 ENCOUNTER — Other Ambulatory Visit: Payer: Self-pay

## 2020-09-15 ENCOUNTER — Ambulatory Visit (INDEPENDENT_AMBULATORY_CARE_PROVIDER_SITE_OTHER): Payer: Medicare HMO | Admitting: Family Medicine

## 2020-09-15 VITALS — BP 139/96 | HR 124 | Temp 98.1°F | Ht 73.0 in | Wt 232.0 lb

## 2020-09-15 DIAGNOSIS — I152 Hypertension secondary to endocrine disorders: Secondary | ICD-10-CM | POA: Diagnosis not present

## 2020-09-15 DIAGNOSIS — E1159 Type 2 diabetes mellitus with other circulatory complications: Secondary | ICD-10-CM | POA: Diagnosis not present

## 2020-09-15 DIAGNOSIS — R000 Tachycardia, unspecified: Secondary | ICD-10-CM

## 2020-09-15 MED ORDER — METOPROLOL SUCCINATE ER 25 MG PO TB24: 25.0000 mg | ORAL_TABLET | Freq: Every day | ORAL | 2 refills | Status: DC

## 2020-09-15 NOTE — Progress Notes (Signed)
Subjective: CC: tachycardia follow up PCP: Anthony Brooklyn, [name]  Anthony Espinoza is a 69 y.o. male presenting to clinic today for:  1. Tachycardia Anthony Espinoza was started on Toprol-XL 12.5 mg daily 3 weeks ago for tachycardia. His lisinopril was also decreased from 5 mg to 2.5 mg due to dizziness. He reports that he is feeling much better and denies dizziness. He brought in a log of BP and pulse readings from home today. His home BP is ranging from 110-130s/70-80s. His home pulse is ranging from 95-107. He denies chest pain, palpitations, shortness of breath, dizziness, syncope, or lightheadedness. He denies side effects of his medications today.  Relevant past medical, surgical, family, and social history reviewed and updated as indicated.  Allergies and medications reviewed and updated.  Allergies  Allergen Reactions   Sulfa Antibiotics Other (See Comments)    Ulcers in mouth   Past Medical History:  Diagnosis Date   Diabetes mellitus without complication ([facility])    Hyperlipidemia    Hypertension     Current Outpatient Medications:    amLODipine (NORVASC) 10 MG tablet, Take 1 tablet by mouth once daily, Disp: 90 tablet, Rfl: 0   aspirin 81 MG chewable tablet, Chew 81 mg by mouth daily., Disp: , Rfl:    atorvastatin (LIPITOR) 20 MG tablet, TAKE 1 TABLET BY MOUTH ONCE DAILY FOR CHOLESTEROL, Disp: 90 tablet, Rfl: 0   cyclobenzaprine (FLEXERIL) 10 MG tablet, TAKE 1 TABLET BY MOUTH THREE TIMES DAILY AS NEEDED FOR MUSCLE SPASMS, Disp: 30 tablet, Rfl: 5   glipiZIDE (GLUCOTROL XL) 10 MG 24 hr tablet, Take 1 tablet (10 mg total) by mouth daily., Disp: 90 tablet, Rfl: 0   ibuprofen (ADVIL) 800 MG tablet, Take 800 mg by mouth every 8 (eight) hours as needed., Disp: , Rfl:    lisinopril (ZESTRIL) 2.5 MG tablet, Take 1 tablet (2.5 mg total) by mouth daily., Disp: 30 tablet, Rfl: 2   metFORMIN (GLUCOPHAGE) 500 MG tablet, Take 2 tablets (1,000 mg total) by mouth 2 (two) times daily  with a meal., Disp: 360 tablet, Rfl: 1   metoprolol succinate (TOPROL XL) 25 MG 24 hr tablet, Take 0.5 tablets (12.5 mg total) by mouth daily., Disp: 15 tablet, Rfl: 2   tamsulosin (FLOMAX) 0.4 MG CAPS capsule, Take 1 capsule (0.4 mg total) by mouth daily after supper., Disp: 90 capsule, Rfl: 2 Social History   Socioeconomic History   Marital status: Divorced    Spouse name: Not on file   Number of children: Not on file   Years of education: Not on file   Highest education level: Not on file  Occupational History   Not on file  Tobacco Use   Smoking status: Former Smoker    Types: Cigarettes    Quit date: 10/28/1999    Years since quitting: 20.8   Smokeless tobacco: Never Used  Vaping Use   Vaping Use: Never used  Substance and Sexual Activity   Alcohol use: Not Currently   Drug use: Never   Sexual activity: Not on file  Other Topics Concern   Not on file  Social History Narrative   Not on file   Social Determinants of Health   Financial Resource Strain:    Difficulty of Paying Living Expenses: Not on file  Food Insecurity:    Worried About Charity fundraiser in the Last Year: Not on file   [employer] of Food in the Last Year: Not on file  Transportation Needs:  Lack of Transportation (Medical): Not on file   Lack of Transportation (Non-Medical): Not on file  Physical Activity:    Days of Exercise per Week: Not on file   Minutes of Exercise per Session: Not on file  Stress:    Feeling of Stress : Not on file  Social Connections:    Frequency of Communication with Friends and Family: Not on file   Frequency of Social Gatherings with Friends and Family: Not on file   Attends Religious Services: Not on file   Active Member of Clubs or Organizations: Not on file   Attends Archivist Meetings: Not on file   Marital Status: Not on file  Intimate Partner Violence:    Fear of Current or Ex-Partner: Not on file   Emotionally  Abused: Not on file   Physically Abused: Not on file   Sexually Abused: Not on file   Family History  Problem Relation Age of Onset   Diabetes Mother    Parkinson's disease Brother     Review of Systems  Per HPI.  Objective: Office vital signs reviewed. BP (!) 139/96    Pulse (!) 124    Temp 98.1 F (36.7 C) (Temporal)    Ht _0  (1.854 m)    Wt 232 lb (105.2 kg)    SpO2 99%    BMI 30.61 kg/m   Physical Examination:  Physical Exam Vitals and nursing note reviewed.  Constitutional:      General: He is not in acute distress.    Appearance: Normal appearance. He is not ill-appearing, toxic-appearing or diaphoretic.  Eyes:     Pupils: Pupils are equal, round, and reactive to light.  Cardiovascular:     Rate and Rhythm: Regular rhythm. Tachycardia present.     Heart sounds: Normal heart sounds. No murmur heard.   Pulmonary:     Effort: Pulmonary effort is normal. No respiratory distress.     Breath sounds: Normal breath sounds.  Musculoskeletal:     Right lower leg: No edema.     Left lower leg: No edema.  Skin:    General: Skin is warm and dry.  Neurological:     General: No focal deficit present.     Mental Status: He is alert and oriented to person, place, and time.     Gait: Gait normal.  Psychiatric:        Mood and Affect: Mood normal.        Behavior: Behavior normal.      Results for orders placed or performed in visit on 07/28/20  CMP14+EGFR  Result Value Ref Range   Glucose 162 (H) 65 - 99 mg/dL   BUN 13 8 - 27 mg/dL   Creatinine, Ser 0.79 0.76 - 1.27 mg/dL   GFR calc non Af Amer 92 >59 mL/min/1.73   GFR calc Af Amer 106 >59 mL/min/1.73   BUN/Creatinine Ratio 16 10 - 24   Sodium 138 134 - 144 mmol/L   Potassium 4.7 3.5 - 5.2 mmol/L   Chloride 100 96 - 106 mmol/L   CO2 21 20 - 29 mmol/L   Calcium 9.4 8.6 - 10.2 mg/dL   Total Protein 7.2 6.0 - 8.5 g/dL   Albumin 4.6 3.8 - 4.8 g/dL   Globulin, Total 2.6 1.5 - 4.5 g/dL   Albumin/Globulin Ratio  1.8 1.2 - 2.2   Bilirubin Total 0.6 0.0 - 1.2 mg/dL   Alkaline Phosphatase 64 44 - 121 IU/L   AST 19 0 -  40 IU/L   ALT 24 0 - 44 IU/L  Bayer DCA Hb A1c Waived  Result Value Ref Range   HB A1C (BAYER DCA - WAIVED) 7.3 (H) <7.0 %     Assessment/ Plan: Anthony Espinoza was seen today for tachycardia.  Diagnoses and all orders for this visit:  Tachycardia Pulse is slightly elevated at home. HR is 124 today in office. Increase Toprol XL to 25 mg daily. Continue to keep log and bring to next visit. If any side effects or HR below 60 are noted, decrease back down to 12.5 mg daily. Follow up in 3 weeks. -     metoprolol succinate (TOPROL XL) 25 MG 24 hr tablet; Take 1 tablet (25 mg total) by mouth daily. Take 1 tablet daily.  Hypertension associated with diabetes ([place]) BP well controlled at home. Continue to keep log and follow up in 3 weeks.   Follow up in 3 weeks, sooner if needed.   The above assessment and management plan was discussed with the patient. The patient verbalized understanding of and has agreed to the management plan. Patient is aware to call the clinic if symptoms persist or worsen. Patient is aware when to return to the clinic for a follow-up visit. Patient educated on when it is appropriate to go to the emergency department.   Marjorie Smolder, [name]-C [facility] [address] [place], [place] [address] [phone]

## 2020-09-15 NOTE — Patient Instructions (Signed)
Hypertension, Adult High blood pressure (hypertension) is when the force of blood pumping through the arteries is too strong. The arteries are the blood vessels that carry blood from the heart throughout the body. Hypertension forces the heart to work harder to pump blood and may cause arteries to become narrow or stiff. Untreated or uncontrolled hypertension can cause a heart attack, heart failure, a stroke, kidney disease, and other problems. A blood pressure reading consists of a higher number over a lower number. Ideally, your blood pressure should be below 120/80. The first ("top") number is called the systolic pressure. It is a measure of the pressure in your arteries as your heart beats. The second ("bottom") number is called the diastolic pressure. It is a measure of the pressure in your arteries as the heart relaxes. What are the causes? The exact cause of this condition is not known. There are some conditions that result in or are related to high blood pressure. What increases the risk? Some risk factors for high blood pressure are under your control. The following factors may make you more likely to develop this condition:  Smoking.  Having type 2 diabetes mellitus, high cholesterol, or both.  Not getting enough exercise or physical activity.  Being overweight.  Having too much fat, sugar, calories, or salt (sodium) in your diet.  Drinking too much alcohol. Some risk factors for high blood pressure may be difficult or impossible to change. Some of these factors include:  Having chronic kidney disease.  Having a family history of high blood pressure.  Age. Risk increases with age.  Race. You may be at higher risk if you are African American.  Gender. Men are at higher risk than women before age 57. After age 78, women are at higher risk than men.  Having obstructive sleep apnea.  Stress. What are the signs or symptoms? High blood pressure may not cause symptoms. Very high  blood pressure (hypertensive crisis) may cause:  Headache.  Anxiety.  Shortness of breath.  Nosebleed.  Nausea and vomiting.  Vision changes.  Severe chest pain.  Seizures. How is this diagnosed? This condition is diagnosed by measuring your blood pressure while you are seated, with your arm resting on a flat surface, your legs uncrossed, and your feet flat on the floor. The cuff of the blood pressure monitor will be placed directly against the skin of your upper arm at the level of your heart. It should be measured at least twice using the same arm. Certain conditions can cause a difference in blood pressure between your right and left arms. Certain factors can cause blood pressure readings to be lower or higher than normal for a short period of time:  When your blood pressure is higher when you are in a health care provider's office than when you are at home, this is called white coat hypertension. Most people with this condition do not need medicines.  When your blood pressure is higher at home than when you are in a health care provider's office, this is called masked hypertension. Most people with this condition may need medicines to control blood pressure. If you have a high blood pressure reading during one visit or you have normal blood pressure with other risk factors, you may be asked to:  Return on a different day to have your blood pressure checked again.  Monitor your blood pressure at home for 1 week or longer. If you are diagnosed with hypertension, you may have other blood or  imaging tests to help your health care provider understand your overall risk for other conditions. How is this treated? This condition is treated by making healthy lifestyle changes, such as eating healthy foods, exercising more, and reducing your alcohol intake. Your health care provider may prescribe medicine if lifestyle changes are not enough to get your blood pressure under control, and  if:  Your systolic blood pressure is above 130.  Your diastolic blood pressure is above 80. Your personal target blood pressure may vary depending on your medical conditions, your age, and other factors. Follow these instructions at home: Eating and drinking   Eat a diet that is high in fiber and potassium, and low in sodium, added sugar, and fat. An example eating plan is called the DASH (Dietary Approaches to Stop Hypertension) diet. To eat this way: ? Eat plenty of fresh fruits and vegetables. Try to fill one half of your plate at each meal with fruits and vegetables. ? Eat whole grains, such as whole-wheat pasta, brown rice, or whole-grain bread. Fill about one fourth of your plate with whole grains. ? Eat or drink low-fat dairy products, such as skim milk or low-fat yogurt. ? Avoid fatty cuts of meat, processed or cured meats, and poultry with skin. Fill about one fourth of your plate with lean proteins, such as fish, chicken without skin, beans, eggs, or tofu. ? Avoid pre-made and processed foods. These tend to be higher in sodium, added sugar, and fat.  Reduce your daily sodium intake. Most people with hypertension should eat less than 1,500 mg of sodium a day.  Do not drink alcohol if: ? Your health care provider tells you not to drink. ? You are pregnant, may be pregnant, or are planning to become pregnant.  If you drink alcohol: ? Limit how much you use to:  0-1 drink a day for women.  0-2 drinks a day for men. ? Be aware of how much alcohol is in your drink. In the [place] one drink equals one 12 oz bottle of beer (355 mL), one 5 oz glass of wine (148 mL), or one 1 oz glass of hard liquor (44 mL). Lifestyle   Work with your health care provider to maintain a healthy body weight or to lose weight. Ask what an ideal weight is for you.  Get at least 30 minutes of exercise most days of the week. Activities may include walking, swimming, or biking.  Include exercise to  strengthen your muscles (resistance exercise), such as Pilates or lifting weights, as part of your weekly exercise routine. Try to do these types of exercises for 30 minutes at least 3 days a week.  Do not use any products that contain nicotine or tobacco, such as cigarettes, e-cigarettes, and chewing tobacco. If you need help quitting, ask your health care provider.  Monitor your blood pressure at home as told by your health care provider.  Keep all follow-up visits as told by your health care provider. This is important. Medicines  Take over-the-counter and prescription medicines only as told by your health care provider. Follow directions carefully. Blood pressure medicines must be taken as prescribed.  Do not skip doses of blood pressure medicine. Doing this puts you at risk for problems and can make the medicine less effective.  Ask your health care provider about side effects or reactions to medicines that you should watch for. Contact a health care provider if you:  Think you are having a reaction to a medicine you  are taking.  Have headaches that keep coming back (recurring).  Feel dizzy.  Have swelling in your ankles.  Have trouble with your vision. Get help right away if you:  Develop a severe headache or confusion.  Have unusual weakness or numbness.  Feel faint.  Have severe pain in your chest or abdomen.  Vomit repeatedly.  Have trouble breathing. Summary  Hypertension is when the force of blood pumping through your arteries is too strong. If this condition is not controlled, it may put you at risk for serious complications.  Your personal target blood pressure may vary depending on your medical conditions, your age, and other factors. For most people, a normal blood pressure is less than 120/80.  Hypertension is treated with lifestyle changes, medicines, or a combination of both. Lifestyle changes include losing weight, eating a healthy, low-sodium diet,  exercising more, and limiting alcohol. This information is not intended to replace advice given to you by your health care provider. Make sure you discuss any questions you have with your health care provider. Document Revised: 07/09/2018 Document Reviewed: 07/09/2018 Elsevier Patient Education  [place]. Sinus Tachycardia  Sinus tachycardia is a kind of fast heartbeat. In sinus tachycardia, the heart beats more than 100 times a minute. Sinus tachycardia starts in a part of the heart called the sinus node. Sinus tachycardia may be harmless, or it may be a sign of a serious condition. What are the causes? This condition may be caused by:  Exercise or exertion.  A fever.  Pain.  Loss of body fluids (dehydration).  Severe bleeding (hemorrhage).  Anxiety and stress.  Certain substances, including: ? Alcohol. ? Caffeine. ? Tobacco and nicotine products. ? Cold medicines. ? Illegal drugs.  Medical conditions including: ? Heart disease. ? An infection. ? An overactive thyroid (hyperthyroidism). ? A lack of red blood cells (anemia). What are the signs or symptoms? Symptoms of this condition include:  A feeling that the heart is beating quickly (palpitations).  Suddenly noticing your heartbeat (cardiac awareness).  Dizziness.  Tiredness (fatigue).  Shortness of breath.  Chest pain.  Nausea.  Fainting. How is this diagnosed? This condition is diagnosed with:  A physical exam.  Other tests, such as: ? Blood tests. ? An electrocardiogram (ECG). This test measures the electrical activity of the heart. ? Ambulatory cardiac monitor. This records your heartbeats for 24 hours or more. You may be referred to a heart specialist (cardiologist). How is this treated? Treatment for this condition depends on the cause or the underlying condition. Treatment may involve:  Treating the underlying condition.  Taking new medicines or changing your current medicines as  told by your health care provider.  Making changes to your diet or lifestyle. Follow these instructions at home: Lifestyle   Do not use any products that contain nicotine or tobacco, such as cigarettes and e-cigarettes. If you need help quitting, ask your health care provider.  Do not use illegal drugs, such as cocaine.  Learn relaxation methods to help you when you get stressed or anxious. These include deep breathing.  Avoid caffeine or other stimulants. Alcohol use   Do not drink alcohol if: ? Your health care provider tells you not to drink. ? You are pregnant, may be pregnant, or are planning to become pregnant.  If you drink alcohol, limit how much you have: ? 0-1 drink a day for women. ? 0-2 drinks a day for men.  Be aware of how much alcohol is in your  drink. In the [place] one drink equals one typical bottle of beer (12 oz), one-half glass of wine (5 oz), or one shot of hard liquor (1 oz). General instructions  Drink enough fluids to keep your urine pale yellow.  Take over-the-counter and prescription medicines only as told by your health care provider.  Keep all follow-up visits as told by your health care provider. This is important. Contact a health care provider if you have:  A fever.  Vomiting or diarrhea that does not go away. Get help right away if you:  Have pain in your chest, upper arms, jaw, or neck.  Become weak or dizzy.  Feel faint.  Have palpitations that do not go away. Summary  In sinus tachycardia, the heart beats more than 100 times a minute.  Sinus tachycardia may be harmless, or it may be a sign of a serious condition.  Treatment for this condition depends on the cause or the underlying condition.  Get help right away if you have pain in your chest, upper arms, jaw, or neck. This information is not intended to replace advice given to you by your health care provider. Make sure you discuss any questions you have with your health care  provider. Document Revised: 12/18/2017 Document Reviewed: 12/18/2017 Elsevier Patient Education  [place].

## 2020-10-05 ENCOUNTER — Encounter: Payer: Self-pay | Admitting: Family Medicine

## 2020-10-05 ENCOUNTER — Other Ambulatory Visit: Payer: Self-pay

## 2020-10-05 ENCOUNTER — Ambulatory Visit (INDEPENDENT_AMBULATORY_CARE_PROVIDER_SITE_OTHER): Payer: Medicare HMO | Admitting: Family Medicine

## 2020-10-05 VITALS — BP 151/78 | HR 113 | Temp 98.5°F | Ht 73.0 in | Wt 232.5 lb

## 2020-10-05 DIAGNOSIS — E1159 Type 2 diabetes mellitus with other circulatory complications: Secondary | ICD-10-CM | POA: Diagnosis not present

## 2020-10-05 DIAGNOSIS — R000 Tachycardia, unspecified: Secondary | ICD-10-CM

## 2020-10-05 DIAGNOSIS — I152 Hypertension secondary to endocrine disorders: Secondary | ICD-10-CM

## 2020-10-05 NOTE — Progress Notes (Signed)
Subjective: CC: tachycardia PCP: [name], [name]  SNK:[id number] R Anthony Espinoza is a 69 y.o. male presenting to clinic today for:  1. Tachycardia Leomar has been checking his BP and HR twice a day at home for the last 3 weeks after increasing his Toprol-XL to 25 mg daily. His BP at home is ranging form 110-130/80s with HR ranging from 87-99. He denies chest pain, shortness of breath, dizziness, light headedness, headaches, fatigue changes in vision, palpitations, or edema. He reports that he feels great.  Relevant past medical, surgical, family, and social history reviewed and updated as indicated.  Allergies and medications reviewed and updated.  Allergies  Allergen Reactions   Sulfa Antibiotics Other (See Comments)    Ulcers in mouth   Past Medical History:  Diagnosis Date   Diabetes mellitus without complication (HCC)    Hyperlipidemia    Hypertension     Current Outpatient Medications:    amLODipine (NORVASC) 10 MG tablet, Take 1 tablet by mouth once daily, Disp: 90 tablet, Rfl: 0   aspirin 81 MG chewable tablet, Chew 81 mg by mouth daily., Disp: , Rfl:    atorvastatin (LIPITOR) 20 MG tablet, TAKE 1 TABLET BY MOUTH ONCE DAILY FOR CHOLESTEROL, Disp: 90 tablet, Rfl: 0   cyclobenzaprine (FLEXERIL) 10 MG tablet, TAKE 1 TABLET BY MOUTH THREE TIMES DAILY AS NEEDED FOR MUSCLE SPASMS, Disp: 30 tablet, Rfl: 5   glipiZIDE (GLUCOTROL XL) 10 MG 24 hr tablet, Take 1 tablet (10 mg total) by mouth daily., Disp: 90 tablet, Rfl: 0   ibuprofen (ADVIL) 800 MG tablet, Take 800 mg by mouth every 8 (eight) hours as needed., Disp: , Rfl:    lisinopril (ZESTRIL) 2.5 MG tablet, Take 1 tablet (2.5 mg total) by mouth daily., Disp: 30 tablet, Rfl: 2   metFORMIN (GLUCOPHAGE) 500 MG tablet, Take 2 tablets (1,000 mg total) by mouth 2 (two) times daily with a meal., Disp: 360 tablet, Rfl: 1   metoprolol succinate (TOPROL XL) 25 MG 24 hr tablet, Take 1 tablet (25 mg total) by mouth daily. Take 1  tablet daily., Disp: 90 tablet, Rfl: 2   tamsulosin (FLOMAX) 0.4 MG CAPS capsule, Take 1 capsule (0.4 mg total) by mouth daily after supper., Disp: 90 capsule, Rfl: 2 Social History   Socioeconomic History   Marital status: Divorced    Spouse name: Not on file   Number of children: Not on file   Years of education: Not on file   Highest education level: Not on file  Occupational History   Not on file  Tobacco Use   Smoking status: Former Smoker    Types: Cigarettes    Quit date: 10/28/1999    Years since quitting: 20.9   Smokeless tobacco: Never Used  Scientific laboratory technician Use: Never used  Substance and Sexual Activity   Alcohol use: Not Currently   Drug use: Never   Sexual activity: Not on file  Other Topics Concern   Not on file  Social History Narrative   Not on file   Social Determinants of Health   Financial Resource Strain:    Difficulty of Paying Living Expenses: Not on file  Food Insecurity:    Worried About Charity fundraiser in the Last Year: Not on file   [employer] of Food in the Last Year: Not on file  Transportation Needs:    Lack of Transportation (Medical): Not on file   Lack of Transportation (Non-Medical): Not on file  Physical Activity:    Days of Exercise per Week: Not on file   Minutes of Exercise per Session: Not on file  Stress:    Feeling of Stress : Not on file  Social Connections:    Frequency of Communication with Friends and Family: Not on file   Frequency of Social Gatherings with Friends and Family: Not on file   Attends Religious Services: Not on file   Active Member of Clubs or Organizations: Not on file   Attends Archivist Meetings: Not on file   Marital Status: Not on file  Intimate Partner Violence:    Fear of Current or Ex-Partner: Not on file   Emotionally Abused: Not on file   Physically Abused: Not on file   Sexually Abused: Not on file   Family History  Problem Relation Age of  Onset   Diabetes Mother    Parkinson's disease Brother     Review of Systems  Negative unless specially indicated above in HPI.  Objective: Office vital signs reviewed. BP (!) 151/78    Pulse (!) 113    Temp 98.5 F (36.9 C) (Temporal)    Ht 6' 1"  (1.854 m)    Wt 232 lb 8 oz (105.5 kg)    BMI 30.67 kg/m   Physical Examination:  Physical Exam Vitals and nursing note reviewed.  Constitutional:      General: He is not in acute distress.    Appearance: Normal appearance. He is not ill-appearing, toxic-appearing or diaphoretic.  Eyes:     Extraocular Movements: Extraocular movements intact.     Pupils: Pupils are equal, round, and reactive to light.  Cardiovascular:     Rate and Rhythm: Normal rate and regular rhythm.     Heart sounds: Normal heart sounds. No murmur heard.   Pulmonary:     Effort: Pulmonary effort is normal. No respiratory distress.     Breath sounds: Normal breath sounds.  Musculoskeletal:     Right lower leg: No edema.     Left lower leg: No edema.  Skin:    General: Skin is warm and dry.  Neurological:     General: No focal deficit present.     Mental Status: He is alert and oriented to person, place, and time.     Gait: Gait normal.  Psychiatric:        Mood and Affect: Mood normal.        Behavior: Behavior normal.      Results for orders placed or performed in visit on 07/28/20  CMP14+EGFR  Result Value Ref Range   Glucose 162 (H) 65 - 99 mg/dL   BUN 13 8 - 27 mg/dL   Creatinine, Ser 0.79 0.76 - 1.27 mg/dL   GFR calc non Af Amer 92 >59 mL/min/1.73   GFR calc Af Amer 106 >59 mL/min/1.73   BUN/Creatinine Ratio 16 10 - 24   Sodium 138 134 - 144 mmol/L   Potassium 4.7 3.5 - 5.2 mmol/L   Chloride 100 96 - 106 mmol/L   CO2 21 20 - 29 mmol/L   Calcium 9.4 8.6 - 10.2 mg/dL   Total Protein 7.2 6.0 - 8.5 g/dL   Albumin 4.6 3.8 - 4.8 g/dL   Globulin, Total 2.6 1.5 - 4.5 g/dL   Albumin/Globulin Ratio 1.8 1.2 - 2.2   Bilirubin Total 0.6 0.0 - 1.2  mg/dL   Alkaline Phosphatase 64 44 - 121 IU/L   AST 19 0 - 40 IU/L  ALT 24 0 - 44 IU/L  Bayer DCA Hb A1c Waived  Result Value Ref Range   HB A1C (BAYER DCA - WAIVED) 7.3 (H) <7.0 %     Assessment/ Plan: Clennon was seen today for tachycardia.  Diagnoses and all orders for this visit:  Tachycardia Well controlled at home on current regimen. Continue to check HR at home and notify for HR consistently over 100.  Hypertension associated with diabetes ([place]) HTN well controlled at home on current regimen. Follow up for new or worsening symptoms.   Follow up with PCP in about 4 months for chronic illnesses.   The above assessment and management plan was discussed with the patient. The patient verbalized understanding of and has agreed to the management plan. Patient is aware to call the clinic if symptoms persist or worsen. Patient is aware when to return to the clinic for a follow-up visit. Patient educated on when it is appropriate to go to the emergency department.   Marjorie Smolder, [name]-C [facility] [address] [place], [place] [address] [phone]

## 2020-10-05 NOTE — Patient Instructions (Signed)
DASH Eating Plan DASH stands for "Dietary Approaches to Stop Hypertension." The DASH eating plan is a healthy eating plan that has been shown to reduce high blood pressure (hypertension). It may also reduce your risk for type 2 diabetes, heart disease, and stroke. The DASH eating plan may also help with weight loss. What are tips for following this plan?  General guidelines  Avoid eating more than 2,300 mg (milligrams) of salt (sodium) a day. If you have hypertension, you may need to reduce your sodium intake to 1,500 mg a day.  Limit alcohol intake to no more than 1 drink a day for nonpregnant women and 2 drinks a day for men. One drink equals 12 oz of beer, 5 oz of wine, or 1 oz of hard liquor.  Work with your health care provider to maintain a healthy body weight or to lose weight. Ask what an ideal weight is for you.  Get at least 30 minutes of exercise that causes your heart to beat faster (aerobic exercise) most days of the week. Activities may include walking, swimming, or biking.  Work with your health care provider or diet and nutrition specialist (dietitian) to adjust your eating plan to your individual calorie needs. Reading food labels   Check food labels for the amount of sodium per serving. Choose foods with less than 5 percent of the Daily Value of sodium. Generally, foods with less than 300 mg of sodium per serving fit into this eating plan.  To find whole grains, look for the word "whole" as the first word in the ingredient list. Shopping  Buy products labeled as "low-sodium" or "no salt added."  Buy fresh foods. Avoid canned foods and premade or frozen meals. Cooking  Avoid adding salt when cooking. Use salt-free seasonings or herbs instead of table salt or sea salt. Check with your health care provider or pharmacist before using salt substitutes.  Do not fry foods. Cook foods using healthy methods such as baking, boiling, grilling, and broiling instead.  Cook with  heart-healthy oils, such as olive, canola, soybean, or sunflower oil. Meal planning  Eat a balanced diet that includes: ? 5 or more servings of fruits and vegetables each day. At each meal, try to fill half of your plate with fruits and vegetables. ? Up to 6-8 servings of whole grains each day. ? Less than 6 oz of lean meat, poultry, or fish each day. A 3-oz serving of meat is about the same size as a deck of cards. One egg equals 1 oz. ? 2 servings of low-fat dairy each day. ? A serving of nuts, seeds, or beans 5 times each week. ? Heart-healthy fats. Healthy fats called Omega-3 fatty acids are found in foods such as flaxseeds and coldwater fish, like sardines, salmon, and mackerel.  Limit how much you eat of the following: ? Canned or prepackaged foods. ? Food that is high in trans fat, such as fried foods. ? Food that is high in saturated fat, such as fatty meat. ? Sweets, desserts, sugary drinks, and other foods with added sugar. ? Full-fat dairy products.  Do not salt foods before eating.  Try to eat at least 2 vegetarian meals each week.  Eat more home-cooked food and less restaurant, buffet, and fast food.  When eating at a restaurant, ask that your food be prepared with less salt or no salt, if possible. What foods are recommended? The items listed may not be a complete list. Talk with your dietitian about   what dietary choices are best for you. Grains Whole-grain or whole-wheat bread. Whole-grain or whole-wheat pasta. Brown rice. Oatmeal. Quinoa. Bulgur. Whole-grain and low-sodium cereals. Pita bread. Low-fat, low-sodium crackers. Whole-wheat flour tortillas. Vegetables Fresh or frozen vegetables (raw, steamed, roasted, or grilled). Low-sodium or reduced-sodium tomato and vegetable juice. Low-sodium or reduced-sodium tomato sauce and tomato paste. Low-sodium or reduced-sodium canned vegetables. Fruits All fresh, dried, or frozen fruit. Canned fruit in natural juice (without  added sugar). Meat and other protein foods Skinless chicken or turkey. Ground chicken or turkey. Pork with fat trimmed off. Fish and seafood. Egg whites. Dried beans, peas, or lentils. Unsalted nuts, nut butters, and seeds. Unsalted canned beans. Lean cuts of beef with fat trimmed off. Low-sodium, lean deli meat. Dairy Low-fat (1%) or fat-free (skim) milk. Fat-free, low-fat, or reduced-fat cheeses. Nonfat, low-sodium ricotta or cottage cheese. Low-fat or nonfat yogurt. Low-fat, low-sodium cheese. Fats and oils Soft margarine without trans fats. Vegetable oil. Low-fat, reduced-fat, or light mayonnaise and salad dressings (reduced-sodium). Canola, safflower, olive, soybean, and sunflower oils. Avocado. Seasoning and other foods Herbs. Spices. Seasoning mixes without salt. Unsalted popcorn and pretzels. Fat-free sweets. What foods are not recommended? The items listed may not be a complete list. Talk with your dietitian about what dietary choices are best for you. Grains Baked goods made with fat, such as croissants, muffins, or some breads. Dry pasta or rice meal packs. Vegetables Creamed or fried vegetables. Vegetables in a cheese sauce. Regular canned vegetables (not low-sodium or reduced-sodium). Regular canned tomato sauce and paste (not low-sodium or reduced-sodium). Regular tomato and vegetable juice (not low-sodium or reduced-sodium). Pickles. Olives. Fruits Canned fruit in a light or heavy syrup. Fried fruit. Fruit in cream or butter sauce. Meat and other protein foods Fatty cuts of meat. Ribs. Fried meat. Bacon. Sausage. Bologna and other processed lunch meats. Salami. Fatback. Hotdogs. Bratwurst. Salted nuts and seeds. Canned beans with added salt. Canned or smoked fish. Whole eggs or egg yolks. Chicken or turkey with skin. Dairy Whole or 2% milk, cream, and half-and-half. Whole or full-fat cream cheese. Whole-fat or sweetened yogurt. Full-fat cheese. Nondairy creamers. Whipped toppings.  Processed cheese and cheese spreads. Fats and oils Butter. Stick margarine. Lard. Shortening. Ghee. Bacon fat. Tropical oils, such as coconut, palm kernel, or palm oil. Seasoning and other foods Salted popcorn and pretzels. Onion salt, garlic salt, seasoned salt, table salt, and sea salt. Worcestershire sauce. Tartar sauce. Barbecue sauce. Teriyaki sauce. Soy sauce, including reduced-sodium. Steak sauce. Canned and packaged gravies. Fish sauce. Oyster sauce. Cocktail sauce. Horseradish that you find on the shelf. Ketchup. Mustard. Meat flavorings and tenderizers. Bouillon cubes. Hot sauce and Tabasco sauce. Premade or packaged marinades. Premade or packaged taco seasonings. Relishes. Regular salad dressings. Where to find more information:  National Heart, Lung, and Blood Institute: www.nhlbi.nih.gov  American Heart Association: www.heart.org Summary  The DASH eating plan is a healthy eating plan that has been shown to reduce high blood pressure (hypertension). It may also reduce your risk for type 2 diabetes, heart disease, and stroke.  With the DASH eating plan, you should limit salt (sodium) intake to 2,300 mg a day. If you have hypertension, you may need to reduce your sodium intake to 1,500 mg a day.  When on the DASH eating plan, aim to eat more fresh fruits and vegetables, whole grains, lean proteins, low-fat dairy, and heart-healthy fats.  Work with your health care provider or diet and nutrition specialist (dietitian) to adjust your eating plan to your   individual calorie needs. This information is not intended to replace advice given to you by your health care provider. Make sure you discuss any questions you have with your health care provider. Document Revised: 10/11/2017 Document Reviewed: 10/22/2016 Elsevier Patient Education  2020 Elsevier Inc.  

## 2020-10-17 ENCOUNTER — Other Ambulatory Visit: Payer: Self-pay | Admitting: Family Medicine

## 2020-10-24 ENCOUNTER — Other Ambulatory Visit: Payer: Self-pay | Admitting: Family Medicine

## 2020-10-24 DIAGNOSIS — E1169 Type 2 diabetes mellitus with other specified complication: Secondary | ICD-10-CM

## 2020-11-14 ENCOUNTER — Other Ambulatory Visit: Payer: Self-pay | Admitting: Family Medicine

## 2020-11-14 DIAGNOSIS — E1159 Type 2 diabetes mellitus with other circulatory complications: Secondary | ICD-10-CM

## 2020-11-14 DIAGNOSIS — E1169 Type 2 diabetes mellitus with other specified complication: Secondary | ICD-10-CM

## 2020-11-14 DIAGNOSIS — R000 Tachycardia, unspecified: Secondary | ICD-10-CM

## 2020-11-14 DIAGNOSIS — E785 Hyperlipidemia, unspecified: Secondary | ICD-10-CM

## 2020-11-28 ENCOUNTER — Other Ambulatory Visit: Payer: Self-pay | Admitting: Family Medicine

## 2020-11-28 DIAGNOSIS — I152 Hypertension secondary to endocrine disorders: Secondary | ICD-10-CM

## 2020-11-28 DIAGNOSIS — N400 Enlarged prostate without lower urinary tract symptoms: Secondary | ICD-10-CM

## 2020-11-28 DIAGNOSIS — E1159 Type 2 diabetes mellitus with other circulatory complications: Secondary | ICD-10-CM

## 2020-12-11 ENCOUNTER — Other Ambulatory Visit: Payer: Self-pay | Admitting: Family Medicine

## 2020-12-11 DIAGNOSIS — E1169 Type 2 diabetes mellitus with other specified complication: Secondary | ICD-10-CM

## 2020-12-11 DIAGNOSIS — E785 Hyperlipidemia, unspecified: Secondary | ICD-10-CM

## 2021-01-13 ENCOUNTER — Other Ambulatory Visit: Payer: Self-pay | Admitting: Family Medicine

## 2021-01-13 DIAGNOSIS — M26609 Unspecified temporomandibular joint disorder, unspecified side: Secondary | ICD-10-CM

## 2021-01-15 ENCOUNTER — Other Ambulatory Visit: Payer: Self-pay | Admitting: Family Medicine

## 2021-01-15 DIAGNOSIS — E1169 Type 2 diabetes mellitus with other specified complication: Secondary | ICD-10-CM

## 2021-01-25 ENCOUNTER — Ambulatory Visit: Payer: Medicare HMO | Admitting: Family Medicine

## 2021-01-26 ENCOUNTER — Ambulatory Visit (INDEPENDENT_AMBULATORY_CARE_PROVIDER_SITE_OTHER): Payer: Medicare HMO | Admitting: Family Medicine

## 2021-01-26 ENCOUNTER — Encounter: Payer: Self-pay | Admitting: Family Medicine

## 2021-01-26 ENCOUNTER — Other Ambulatory Visit: Payer: Self-pay

## 2021-01-26 VITALS — BP 151/86 | HR 125 | Temp 98.3°F | Ht 73.0 in | Wt 228.6 lb

## 2021-01-26 DIAGNOSIS — Z1211 Encounter for screening for malignant neoplasm of colon: Secondary | ICD-10-CM | POA: Diagnosis not present

## 2021-01-26 DIAGNOSIS — E1159 Type 2 diabetes mellitus with other circulatory complications: Secondary | ICD-10-CM | POA: Diagnosis not present

## 2021-01-26 DIAGNOSIS — E1169 Type 2 diabetes mellitus with other specified complication: Secondary | ICD-10-CM

## 2021-01-26 DIAGNOSIS — E785 Hyperlipidemia, unspecified: Secondary | ICD-10-CM | POA: Diagnosis not present

## 2021-01-26 DIAGNOSIS — N400 Enlarged prostate without lower urinary tract symptoms: Secondary | ICD-10-CM

## 2021-01-26 DIAGNOSIS — R000 Tachycardia, unspecified: Secondary | ICD-10-CM | POA: Diagnosis not present

## 2021-01-26 DIAGNOSIS — I152 Hypertension secondary to endocrine disorders: Secondary | ICD-10-CM

## 2021-01-26 DIAGNOSIS — Z23 Encounter for immunization: Secondary | ICD-10-CM

## 2021-01-26 LAB — BAYER DCA HB A1C WAIVED: HB A1C (BAYER DCA - WAIVED): 6.6 % (ref <7.0)

## 2021-01-26 MED ORDER — AMLODIPINE BESYLATE 10 MG PO TABS: 10.0000 mg | ORAL_TABLET | Freq: Every day | ORAL | 1 refills | Status: DC

## 2021-01-26 MED ORDER — TAMSULOSIN HCL 0.4 MG PO CAPS: ORAL_CAPSULE | ORAL | 1 refills | Status: DC

## 2021-01-26 MED ORDER — METOPROLOL SUCCINATE ER 50 MG PO TB24: 50.0000 mg | ORAL_TABLET | Freq: Every day | ORAL | 1 refills | Status: DC

## 2021-01-26 NOTE — Progress Notes (Signed)
Assessment & Plan:  1. DM type 2 with diabetic dyslipidemia ([place]) Lab Results  Component Value Date   HGBA1C 6.6 01/26/2021   HGBA1C 7.3 (H) 07/28/2020   HGBA1C 6.8 01/26/2020    - Diabetes is at goal of A1c < 7. - Medications: continue current medications - Patient is currently taking a statin. Patient is taking an ACE-inhibitor/ARB.  - Instruction/counseling given: reminded to get eye exam and discussed foot care  Diabetes Health Maintenance Due  Topic Date Due  . OPHTHALMOLOGY EXAM  12/29/2020  . HEMOGLOBIN A1C  07/29/2021  . FOOT EXAM  01/26/2022    Lab Results  Component Value Date   LABMICR 3.0 10/28/2019   - Bayer DCA Hb A1c Waived  2. Hypertension associated with diabetes ([place]) Well controlled on current regimen.  - Lipid panel - CBC with Differential/Platelet - CMP14+EGFR - amLODipine (NORVASC) 10 MG tablet; Take 1 tablet (10 mg total) by mouth daily.  Dispense: 90 tablet; Refill: 1  3. Tachycardia Uncontrolled. Metoprolol increased from 25 mg to 50 mg once daily. - metoprolol succinate (TOPROL XL) 50 MG 24 hr tablet; Take 1 tablet (50 mg total) by mouth daily.  Dispense: 90 tablet; Refill: 1  4. Benign prostatic hyperplasia without lower urinary tract symptoms Well controlled on current regimen.  - tamsulosin (FLOMAX) 0.4 MG CAPS capsule; TAKE 1 CAPSULE BY MOUTH ONCE DAILY AFTER SUPPER  Dispense: 90 capsule; Refill: 1  5. Screen for colon cancer - Cologuard re-ordered.   Return in about 3 months (around 04/28/2021) for annual physical.  Hendricks Limes, MSN, APRN, [name]-C [facility]  Subjective:    Patient ID: Anthony Espinoza, Anthony Espinoza    DOB: 1951/03/01, 70 y.o.   MRN: [id number]  Patient Care Team: [name], [name] as PCP - General (Family Medicine)   Chief Complaint:  Chief Complaint  Patient presents with  . Diabetes  . Hyperlipidemia    Check up of chronic medical conditions     HPI: Anthony Espinoza is a 70 y.o.  Anthony Espinoza presenting on 01/26/2021 for Diabetes and Hyperlipidemia (Check up of chronic medical conditions )  Diabetes: Patient presents for follow up of diabetes. Current symptoms include: none. Known diabetic complications: none. Medication compliance: yes. Current diet: in general, a "healthy" diet  . Current exercise: walking. Home blood sugar records: patient does not check sugars. Is he  on ACE inhibitor or angiotensin II receptor blocker? Yes. Is he on a statin? Yes.   Tachycardia: resting heart rate at home 87-98. He does not feel his heart racing.  Hypertension: patient is checking his BP at home. Systolic ranges 947-654. Diastolic ranges 65-03.  Patient reports he never received Cologuard.  New complaints: None  Social history:  Relevant past medical, surgical, family and social history reviewed and updated as indicated. Interim medical history since our last visit reviewed.  Allergies and medications reviewed and updated.  DATA REVIEWED: CHART IN EPIC  ROS: Negative unless specifically indicated above in HPI.    Current Outpatient Medications:  .  amLODipine (NORVASC) 10 MG tablet, Take 1 tablet by mouth once daily, Disp: 90 tablet, Rfl: 0 .  aspirin 81 MG chewable tablet, Chew 81 mg by mouth daily., Disp: , Rfl:  .  atorvastatin (LIPITOR) 20 MG tablet, TAKE 1 TABLET BY MOUTH ONCE DAILY FOR CHOLESTEROL, Disp: 90 tablet, Rfl: 0 .  cyclobenzaprine (FLEXERIL) 10 MG tablet, Take 1 tablet by mouth three times daily as needed for muscle spasm, Disp:  30 tablet, Rfl: 0 .  glipiZIDE (GLUCOTROL XL) 10 MG 24 hr tablet, Take 1 tablet by mouth once daily, Disp: 90 tablet, Rfl: 0 .  ibuprofen (ADVIL) 800 MG tablet, Take 800 mg by mouth every 8 (eight) hours as needed., Disp: , Rfl:  .  lisinopril (ZESTRIL) 2.5 MG tablet, Take 1 tablet (2.5 mg total) by mouth daily., Disp: 30 tablet, Rfl: 2 .  metFORMIN (GLUCOPHAGE) 500 MG tablet, TAKE 2 TABLETS BY MOUTH TWICE DAILY WITH MEALS, Disp: 360  tablet, Rfl: 0 .  metoprolol succinate (TOPROL XL) 25 MG 24 hr tablet, Take 1 tablet (25 mg total) by mouth daily. Take 1 tablet daily., Disp: 90 tablet, Rfl: 2 .  tamsulosin (FLOMAX) 0.4 MG CAPS capsule, TAKE 1 CAPSULE BY MOUTH ONCE DAILY AFTER SUPPER, Disp: 90 capsule, Rfl: 0   Allergies  Allergen Reactions  . Sulfa Antibiotics Other (See Comments)    Ulcers in mouth  . Other     Other reaction(s): GI problems   Past Medical History:  Diagnosis Date  . Diabetes mellitus without complication ([place])   . Hyperlipidemia   . Hypertension     Past Surgical History:  Procedure Laterality Date  . BACK SURGERY     x6  . NECK SURGERY    . SPINAL CORD STIMULATOR IMPLANT      Social History   Socioeconomic History  . Marital status: Divorced    Spouse name: Not on file  . Number of children: Not on file  . Years of education: Not on file  . Highest education level: Not on file  Occupational History  . Not on file  Tobacco Use  . Smoking status: Former Smoker    Types: Cigarettes    Quit date: 10/28/1999    Years since quitting: 21.2  . Smokeless tobacco: Never Used  Vaping Use  . Vaping Use: Never used  Substance and Sexual Activity  . Alcohol use: Not Currently  . Drug use: Never  . Sexual activity: Not on file  Other Topics Concern  . Not on file  Social History Narrative  . Not on file   Social Determinants of Health   Financial Resource Strain: Not on file  Food Insecurity: Not on file  Transportation Needs: Not on file  Physical Activity: Not on file  Stress: Not on file  Social Connections: Not on file  Intimate Partner Violence: Not on file        Objective:    BP (!) 151/86   Pulse (!) 125   Temp 98.3 F (36.8 C) (Temporal)   Ht _0  (1.854 m)   Wt 228 lb 9.6 oz (103.7 kg)   SpO2 99%   BMI 30.16 kg/m   Wt Readings from Last 3 Encounters:  01/26/21 228 lb 9.6 oz (103.7 kg)  10/05/20 232 lb 8 oz (105.5 kg)  09/15/20 232 lb (105.2 kg)     Physical Exam Vitals reviewed.  Constitutional:      General: He is not in acute distress.    Appearance: Normal appearance. He is obese. He is not ill-appearing, toxic-appearing or diaphoretic.  HENT:     Head: Normocephalic and atraumatic.  Eyes:     General: No scleral icterus.       Right eye: No discharge.        Left eye: No discharge.     Conjunctiva/sclera: Conjunctivae normal.  Cardiovascular:     Rate and Rhythm: Normal rate and regular rhythm.  Heart sounds: Normal heart sounds. No murmur heard. No friction rub. No gallop.   Pulmonary:     Effort: Pulmonary effort is normal. No respiratory distress.     Breath sounds: Normal breath sounds. No stridor. No wheezing, rhonchi or rales.  Musculoskeletal:        General: Normal range of motion.     Cervical back: Normal range of motion.  Skin:    General: Skin is warm and dry.  Neurological:     Mental Status: He is alert and oriented to person, place, and time. Mental status is at baseline.  Psychiatric:        Mood and Affect: Mood normal.        Behavior: Behavior normal.        Thought Content: Thought content normal.        Judgment: Judgment normal.    Diabetic Foot Exam - Simple   Simple Foot Form Diabetic Foot exam was performed with the following findings: Yes 01/26/2021  9:15 AM  Visual Inspection No deformities, no ulcerations, no other skin breakdown bilaterally: Yes Sensation Testing Intact to touch and monofilament testing bilaterally: Yes Pulse Check Posterior Tibialis and Dorsalis pulse intact bilaterally: Yes Comments     No results found for: TSH Lab Results  Component Value Date   WBC 6.2 10/28/2019   HGB 16.4 10/28/2019   HCT 46.7 10/28/2019   MCV 87 10/28/2019   PLT 278 10/28/2019   Lab Results  Component Value Date   NA 138 07/28/2020   K 4.7 07/28/2020   CO2 21 07/28/2020   GLUCOSE 162 (H) 07/28/2020   BUN 13 07/28/2020   CREATININE 0.79 07/28/2020   BILITOT 0.6  07/28/2020   ALKPHOS 64 07/28/2020   AST 19 07/28/2020   ALT 24 07/28/2020   PROT 7.2 07/28/2020   ALBUMIN 4.6 07/28/2020   CALCIUM 9.4 07/28/2020   Lab Results  Component Value Date   CHOL 147 10/28/2019   Lab Results  Component Value Date   HDL 44 10/28/2019   Lab Results  Component Value Date   LDLCALC 84 10/28/2019   Lab Results  Component Value Date   TRIG 106 10/28/2019   Lab Results  Component Value Date   CHOLHDL 3.3 10/28/2019   Lab Results  Component Value Date   HGBA1C 7.3 (H) 07/28/2020

## 2021-01-27 DIAGNOSIS — R000 Tachycardia, unspecified: Secondary | ICD-10-CM | POA: Insufficient documentation

## 2021-01-27 LAB — CBC WITH DIFFERENTIAL/PLATELET
Basophils Absolute: 0.1 10*3/uL (ref 0.0–0.2)
Basos: 1 %
EOS (ABSOLUTE): 0.5 10*3/uL — ABNORMAL HIGH (ref 0.0–0.4)
Eos: 6 %
Hematocrit: 45.4 % (ref 37.5–51.0)
Hemoglobin: 15.8 g/dL (ref 13.0–17.7)
Immature Grans (Abs): 0 10*3/uL (ref 0.0–0.1)
Immature Granulocytes: 0 %
Lymphocytes Absolute: 2.3 10*3/uL (ref 0.7–3.1)
Lymphs: 28 %
MCH: 31.3 pg (ref 26.6–33.0)
MCHC: 34.8 g/dL (ref 31.5–35.7)
MCV: 90 fL (ref 79–97)
Monocytes Absolute: 0.6 10*3/uL (ref 0.1–0.9)
Monocytes: 8 %
Neutrophils Absolute: 4.6 10*3/uL (ref 1.4–7.0)
Neutrophils: 57 %
Platelets: 282 10*3/uL (ref 150–450)
RBC: 5.04 x10E6/uL (ref 4.14–5.80)
RDW: 12.8 % (ref 11.6–15.4)
WBC: 8 10*3/uL (ref 3.4–10.8)

## 2021-01-27 LAB — CMP14+EGFR
ALT: 16 IU/L (ref 0–44)
AST: 19 IU/L (ref 0–40)
Albumin/Globulin Ratio: 1.9 (ref 1.2–2.2)
Albumin: 4.6 g/dL (ref 3.8–4.8)
Alkaline Phosphatase: 59 IU/L (ref 44–121)
BUN/Creatinine Ratio: 17 (ref 10–24)
BUN: 13 mg/dL (ref 8–27)
Bilirubin Total: 0.4 mg/dL (ref 0.0–1.2)
CO2: 20 mmol/L (ref 20–29)
Calcium: 9.2 mg/dL (ref 8.6–10.2)
Chloride: 102 mmol/L (ref 96–106)
Creatinine, Ser: 0.76 mg/dL (ref 0.76–1.27)
Globulin, Total: 2.4 g/dL (ref 1.5–4.5)
Glucose: 200 mg/dL — ABNORMAL HIGH (ref 65–99)
Potassium: 4.9 mmol/L (ref 3.5–5.2)
Sodium: 139 mmol/L (ref 134–144)
Total Protein: 7 g/dL (ref 6.0–8.5)
eGFR: 97 mL/min/{1.73_m2} (ref >59)

## 2021-01-27 LAB — LIPID PANEL
Chol/HDL Ratio: 3 ratio (ref 0.0–5.0)
Cholesterol, Total: 120 mg/dL (ref 100–199)
HDL: 40 mg/dL (ref >39)
LDL Chol Calc (NIH): 62 mg/dL (ref 0–99)
Triglycerides: 97 mg/dL (ref 0–149)
VLDL Cholesterol Cal: 18 mg/dL (ref 5–40)

## 2021-01-27 NOTE — Progress Notes (Signed)
R/c about labs ok to leave VM

## 2021-02-02 ENCOUNTER — Telehealth: Payer: Self-pay

## 2021-02-02 NOTE — Telephone Encounter (Signed)
Patient's last labs mailed

## 2021-02-07 DIAGNOSIS — Z1211 Encounter for screening for malignant neoplasm of colon: Secondary | ICD-10-CM | POA: Diagnosis not present

## 2021-02-10 ENCOUNTER — Other Ambulatory Visit: Payer: Self-pay | Admitting: Family Medicine

## 2021-02-10 ENCOUNTER — Telehealth: Payer: Self-pay | Admitting: *Deleted

## 2021-02-10 DIAGNOSIS — M26609 Unspecified temporomandibular joint disorder, unspecified side: Secondary | ICD-10-CM

## 2021-02-10 NOTE — Telephone Encounter (Signed)
Key: [id number] - PA Case ID: [id number] - Rx #: [id number]  Drug Cyclobenzaprine HCl 10MG  tablets Form [employer] Electronic [place] Form [phone] NCPDP) Original Claim Info [phone]  Sent to plan

## 2021-02-10 NOTE — Telephone Encounter (Signed)
Approved. Pharmacy notified

## 2021-02-17 LAB — COLOGUARD: Cologuard: NEGATIVE

## 2021-03-03 ENCOUNTER — Ambulatory Visit (INDEPENDENT_AMBULATORY_CARE_PROVIDER_SITE_OTHER): Payer: Medicare HMO

## 2021-03-03 DIAGNOSIS — Z0000 Encounter for general adult medical examination without abnormal findings: Secondary | ICD-10-CM | POA: Diagnosis not present

## 2021-03-03 NOTE — Patient Instructions (Signed)
  [facility] Maintenance Summary and Written Plan of Care  Mr. Anthony Espinoza ,  Thank you for allowing me to perform your Medicare Annual Wellness Visit and for your ongoing commitment to your health.   Health Maintenance & Immunization History Health Maintenance  Topic Date Due  . OPHTHALMOLOGY EXAM  12/29/2020  . INFLUENZA VACCINE  06/12/2021  . HEMOGLOBIN A1C  07/29/2021  . FOOT EXAM  01/26/2022  . Fecal DNA (Cologuard)  02/08/2024  . TETANUS/TDAP  01/27/2031  . COVID-19 Vaccine  Completed  . Hepatitis C Screening  Completed  . PNA vac Low Risk Adult  Completed  . HPV VACCINES  Aged Out   Immunization History  Administered Date(s) Administered  . Fluad Quad(high Dose 65+) 08/25/2020  . Influenza, High Dose Seasonal PF 08/15/2016, 09/06/2017, 09/18/2018  . Influenza, Quadrivalent, Recombinant, Inj, Pf 08/25/2019  . Influenza, Seasonal, Injecte, Preservative Fre 09/30/2015  . Influenza,trivalent, recombinat, inj, PF 08/26/2014  . Moderna Sars-Covid-2 Vaccination 02/17/2020, 03/15/2020, 09/22/2020  . Pneumococcal Conjugate-13 06/14/2016, 08/25/2019  . Pneumococcal Polysaccharide-23 09/06/2017  . Tdap 01/26/2021    These are the patient goals that we discussed: Goals Addressed              This Visit's Progress     Patient Stated   .  Obtain Annual Eye (retinal)  Exam  (pt-stated)   On track     Has an appointment in May for eye exam      Other   .  DIET - EAT MORE FRUITS AND VEGETABLES   On track   .  Exercise 150 min/wk Moderate Activity   On track       This is a list of Health Maintenance Items that are overdue or due now: Health Maintenance Due  Topic Date Due  . OPHTHALMOLOGY EXAM  12/29/2020     Orders/Referrals Placed Today: No orders of the defined types were placed in this encounter.  (Contact our referral department at [phone] if you have not spoken with someone about your referral appointment within the next 5 days)     Follow-up Plan  Scheduled with Hendricks Limes, [name] 04/28/21 at 11:05.

## 2021-03-03 NOTE — Progress Notes (Signed)
MEDICARE ANNUAL WELLNESS VISIT  03/03/2021  Telephone Visit Disclaimer This Medicare AWV was conducted by telephone due to national recommendations for restrictions regarding the COVID-19 Pandemic (e.g. social distancing).  I verified, using two identifiers, that I am speaking with [name] or their authorized healthcare agent. I discussed the limitations, risks, security, and privacy concerns of performing an evaluation and management service by telephone and the potential availability of an in-person appointment in the future. The patient expressed understanding and agreed to proceed.  Location of Patient: Home Location of Provider (nurse):  [facility]  Subjective:    Anthony Espinoza is a 70 y.o. male patient of [name], [name] who had a Medicare Annual Wellness Visit today via telephone. Antionne is Retired and lives with their spouse. he has no children. he reports that he is socially active and does interact with friends/family regularly. he is minimally physically active and enjoys hunting.  Patient Care Team: [name], [name] as PCP - General (Family Medicine) Harlen Labs, MD as Referring Physician (Optometry)  Advanced Directives 03/03/2021 03/02/2020  Does Patient Have a Medical Advance Directive? Yes Yes  Type of Advance Directive Living will Living will  Does patient want to make changes to medical advance directive? No - Patient declined No - Patient declined  Would patient like information on creating a medical advance directive? - No - Patient declined    Hospital Utilization Over the Past 12 Months: # of hospitalizations or ER visits: 0 # of surgeries: 0  Review of Systems    Patient reports that his overall health is unchanged compared to last year.  Patient Reported Readings (BP, Pulse, CBG, Weight, etc) none  Pain Assessment Pain : No/denies pain     Current Medications & Allergies (verified) Allergies as of  03/03/2021      Reactions   Sulfa Antibiotics Other (See Comments)   Ulcers in mouth   Other    Other reaction(s): GI problems      Medication List       Accurate as of March 03, 2021 10:07 AM. If you have any questions, ask your nurse or doctor.        amLODipine 10 MG tablet Commonly known as: NORVASC Take 1 tablet (10 mg total) by mouth daily.   aspirin 81 MG chewable tablet Chew 81 mg by mouth daily.   atorvastatin 20 MG tablet Commonly known as: LIPITOR TAKE 1 TABLET BY MOUTH ONCE DAILY FOR CHOLESTEROL   cyclobenzaprine 10 MG tablet Commonly known as: FLEXERIL Take 1 tablet by mouth three times daily as needed for muscle spasm   glipiZIDE 10 MG 24 hr tablet Commonly known as: GLUCOTROL XL Take 1 tablet by mouth once daily   ibuprofen 800 MG tablet Commonly known as: ADVIL Take 800 mg by mouth every 8 (eight) hours as needed.   lisinopril 2.5 MG tablet Commonly known as: ZESTRIL Take 1 tablet (2.5 mg total) by mouth daily.   metFORMIN 500 MG tablet Commonly known as: GLUCOPHAGE TAKE 2 TABLETS BY MOUTH TWICE DAILY WITH MEALS   metoprolol succinate 50 MG 24 hr tablet Commonly known as: Toprol XL Take 1 tablet (50 mg total) by mouth daily.   tamsulosin 0.4 MG Caps capsule Commonly known as: FLOMAX TAKE 1 CAPSULE BY MOUTH ONCE DAILY AFTER SUPPER       History (reviewed): Past Medical History:  Diagnosis Date  . Diabetes mellitus without complication ([place])   .  Hyperlipidemia   . Hypertension    Past Surgical History:  Procedure Laterality Date  . BACK SURGERY     x6  . NECK SURGERY    . SPINAL CORD STIMULATOR IMPLANT     Family History  Problem Relation Age of Onset  . Diabetes Mother   . Parkinson's disease Brother    Social History   Socioeconomic History  . Marital status: Divorced    Spouse name: Not on file  . Number of children: Not on file  . Years of education: Not on file  . Highest education level: Not on file  Occupational  History  . Not on file  Tobacco Use  . Smoking status: Former Smoker    Types: Cigarettes    Quit date: 10/28/1999    Years since quitting: 21.3  . Smokeless tobacco: Never Used  Vaping Use  . Vaping Use: Never used  Substance and Sexual Activity  . Alcohol use: Not Currently  . Drug use: Never  . Sexual activity: Not on file  Other Topics Concern  . Not on file  Social History Narrative  . Not on file   Social Determinants of Health   Financial Resource Strain: Not on file  Food Insecurity: Not on file  Transportation Needs: Not on file  Physical Activity: Not on file  Stress: Not on file  Social Connections: Not on file    Activities of Daily Living In your present state of health, do you have any difficulty performing the following activities: 03/03/2021  Hearing? N  Vision? N  Difficulty concentrating or making decisions? N  Walking or climbing stairs? N  Dressing or bathing? N  Doing errands, shopping? N  Preparing Food and eating ? N  Using the Toilet? N  In the past six months, have you accidently leaked urine? N  Do you have problems with loss of bowel control? N  Managing your Medications? N  Managing your Finances? N  Housekeeping or managing your Housekeeping? N  Some recent data might be hidden    Patient Education/ Literacy How often do you need to have someone help you when you read instructions, pamphlets, or other written materials from your doctor or pharmacy?: 2 - Rarely What is the last grade level you completed in school?: 12th grade  Exercise Current Exercise Habits: Home exercise routine, Type of exercise: walking, Time (Minutes): 30, Frequency (Times/Week): 5, Weekly Exercise (Minutes/Week): 150, Intensity: Mild  Diet Patient reports consuming 2 meals a day and 2 snack(s) a day Patient reports that his primary diet is: Regular Patient reports that she does have regular access to food.   Depression Screen PHQ 2/9 Scores 03/03/2021  01/26/2021 10/05/2020 09/15/2020 08/25/2020 07/28/2020 01/26/2020  PHQ - 2 Score 0 0 0 0 0 0 0  PHQ- 9 Score - - - - - 0 -     Fall Risk Fall Risk  03/03/2021 01/26/2021 10/05/2020 09/15/2020 08/25/2020  Falls in the past year? 0 0 0 0 0  Number falls in past yr: - 0 - - -  Injury with Fall? - 0 - - -  Follow up - - - - -     Objective:  [name] seemed alert and oriented and he participated appropriately during our telephone visit.  Blood Pressure Weight BMI  BP Readings from Last 3 Encounters:  01/26/21 (!) 151/86  10/05/20 (!) 151/78  09/15/20 (!) 139/96   Wt Readings from Last 3 Encounters:  01/26/21 228 lb 9.6  oz (103.7 kg)  10/05/20 232 lb 8 oz (105.5 kg)  09/15/20 232 lb (105.2 kg)   BMI Readings from Last 1 Encounters:  01/26/21 30.16 kg/m    *Unable to obtain current vital signs, weight, and BMI due to telephone visit type  Hearing/Vision  . [place] did not seem to have difficulty with hearing/understanding during the telephone conversation . Reports that he has not had a formal eye exam by an eye care professional within the past year . Reports that he has not had a formal hearing evaluation within the past year *Unable to fully assess hearing and vision during telephone visit type  Cognitive Function: 6CIT Screen 03/03/2021 03/02/2020  What Year? 0 points 0 points  What month? 0 points 0 points  What time? 0 points 0 points  Count back from 20 4 points 0 points  Months in reverse 0 points 0 points  Repeat phrase 0 points 4 points  Total Score 4 4   (Normal:0-7, Significant for Dysfunction: >8)  Normal Cognitive Function Screening: Yes   Immunization & Health Maintenance Record Immunization History  Administered Date(s) Administered  . Fluad Quad(high Dose 65+) 08/25/2020  . Influenza, High Dose Seasonal PF 08/15/2016, 09/06/2017, 09/18/2018  . Influenza, Quadrivalent, Recombinant, Inj, Pf 08/25/2019  . Influenza, Seasonal, Injecte, Preservative Fre  09/30/2015  . Influenza,trivalent, recombinat, inj, PF 08/26/2014  . Moderna Sars-Covid-2 Vaccination 02/17/2020, 03/15/2020, 09/22/2020  . Pneumococcal Conjugate-13 06/14/2016, 08/25/2019  . Pneumococcal Polysaccharide-23 09/06/2017  . Tdap 01/26/2021    Health Maintenance  Topic Date Due  . OPHTHALMOLOGY EXAM  12/29/2020  . INFLUENZA VACCINE  06/12/2021  . HEMOGLOBIN A1C  07/29/2021  . FOOT EXAM  01/26/2022  . Fecal DNA (Cologuard)  02/08/2024  . TETANUS/TDAP  01/27/2031  . COVID-19 Vaccine  Completed  . Hepatitis C Screening  Completed  . PNA vac Low Risk Adult  Completed  . HPV VACCINES  Aged Out       Assessment  This is a routine wellness examination for Anthony Espinoza.  Health Maintenance: Due or Overdue Health Maintenance Due  Topic Date Due  . OPHTHALMOLOGY EXAM  12/29/2020    [name] does not need a referral for Community Assistance: Care Management:   no Social Work:    no Prescription Assistance:  no Nutrition/Diabetes Education:  no   Plan:  Personalized Goals Goals Addressed              This Visit's Progress     Patient Stated   .  Obtain Annual Eye (retinal)  Exam  (pt-stated)   On track     Has an appointment in May for eye exam      Other   .  DIET - EAT MORE FRUITS AND VEGETABLES   On track   .  Exercise 150 min/wk Moderate Activity   On track     Personalized Health Maintenance & Screening Recommendations  Annual Eye Exam  Lung Cancer Screening Recommended: no (Low Dose CT Chest recommended if Age 51-80 years, 30 pack-year currently smoking OR have quit w/in past 15 years) Hepatitis C Screening recommended: no HIV Screening recommended: no  Advanced Directives: Written information was not prepared per patient's request.  Referrals & Orders No orders of the defined types were placed in this encounter.   Follow-up Plan . Follow-up with [name], [name] as planned . Schedule 04/28/21   I have personally reviewed  and noted the following in the patient's chart:   .  Medical and social history . Use of alcohol, tobacco or illicit drugs  . Current medications and supplements . Functional ability and status . Nutritional status . Physical activity . Advanced directives . List of other physicians . Hospitalizations, surgeries, and ER visits in previous 12 months . Vitals . Screenings to include cognitive, depression, and falls . Referrals and appointments  In addition, I have reviewed and discussed with [name] certain preventive protocols, quality metrics, and best practice recommendations. A written personalized care plan for preventive services as well as general preventive health recommendations is available and can be mailed to the patient at his request.      Maud Deed [facility]  [id number]

## 2021-03-16 ENCOUNTER — Other Ambulatory Visit: Payer: Self-pay | Admitting: Family Medicine

## 2021-03-16 DIAGNOSIS — E1159 Type 2 diabetes mellitus with other circulatory complications: Secondary | ICD-10-CM

## 2021-04-17 ENCOUNTER — Other Ambulatory Visit: Payer: Self-pay | Admitting: Family Medicine

## 2021-04-19 ENCOUNTER — Other Ambulatory Visit: Payer: Self-pay | Admitting: Family Medicine

## 2021-04-19 DIAGNOSIS — E1169 Type 2 diabetes mellitus with other specified complication: Secondary | ICD-10-CM

## 2021-04-28 ENCOUNTER — Other Ambulatory Visit: Payer: Self-pay

## 2021-04-28 ENCOUNTER — Ambulatory Visit (INDEPENDENT_AMBULATORY_CARE_PROVIDER_SITE_OTHER): Payer: Medicare HMO | Admitting: Family Medicine

## 2021-04-28 ENCOUNTER — Encounter: Payer: Self-pay | Admitting: Family Medicine

## 2021-04-28 VITALS — BP 132/79 | HR 129 | Temp 98.3°F | Ht 73.0 in | Wt 229.4 lb

## 2021-04-28 DIAGNOSIS — Z125 Encounter for screening for malignant neoplasm of prostate: Secondary | ICD-10-CM | POA: Diagnosis not present

## 2021-04-28 DIAGNOSIS — E1159 Type 2 diabetes mellitus with other circulatory complications: Secondary | ICD-10-CM

## 2021-04-28 DIAGNOSIS — N400 Enlarged prostate without lower urinary tract symptoms: Secondary | ICD-10-CM

## 2021-04-28 DIAGNOSIS — E785 Hyperlipidemia, unspecified: Secondary | ICD-10-CM | POA: Diagnosis not present

## 2021-04-28 DIAGNOSIS — E1169 Type 2 diabetes mellitus with other specified complication: Secondary | ICD-10-CM | POA: Diagnosis not present

## 2021-04-28 DIAGNOSIS — Z0001 Encounter for general adult medical examination with abnormal findings: Secondary | ICD-10-CM

## 2021-04-28 DIAGNOSIS — Z7189 Other specified counseling: Secondary | ICD-10-CM

## 2021-04-28 DIAGNOSIS — I152 Hypertension secondary to endocrine disorders: Secondary | ICD-10-CM | POA: Diagnosis not present

## 2021-04-28 DIAGNOSIS — M26609 Unspecified temporomandibular joint disorder, unspecified side: Secondary | ICD-10-CM | POA: Insufficient documentation

## 2021-04-28 DIAGNOSIS — M5136 Other intervertebral disc degeneration, lumbar region: Secondary | ICD-10-CM

## 2021-04-28 DIAGNOSIS — R000 Tachycardia, unspecified: Secondary | ICD-10-CM

## 2021-04-28 DIAGNOSIS — R6889 Other general symptoms and signs: Secondary | ICD-10-CM | POA: Diagnosis not present

## 2021-04-28 DIAGNOSIS — Z0000 Encounter for general adult medical examination without abnormal findings: Secondary | ICD-10-CM

## 2021-04-28 LAB — CBC WITH DIFFERENTIAL/PLATELET
Basophils Absolute: 0.1 10*3/uL (ref 0.0–0.2)
Basos: 1 %
EOS (ABSOLUTE): 0.4 10*3/uL (ref 0.0–0.4)
Eos: 4 %
Hematocrit: 45.3 % (ref 37.5–51.0)
Hemoglobin: 15.7 g/dL (ref 13.0–17.7)
Immature Grans (Abs): 0 10*3/uL (ref 0.0–0.1)
Immature Granulocytes: 0 %
Lymphocytes Absolute: 2.2 10*3/uL (ref 0.7–3.1)
Lymphs: 28 %
MCH: 31.7 pg (ref 26.6–33.0)
MCHC: 34.7 g/dL (ref 31.5–35.7)
MCV: 91 fL (ref 79–97)
Monocytes Absolute: 0.8 10*3/uL (ref 0.1–0.9)
Monocytes: 10 %
Neutrophils Absolute: 4.5 10*3/uL (ref 1.4–7.0)
Neutrophils: 57 %
Platelets: 260 10*3/uL (ref 150–450)
RBC: 4.96 x10E6/uL (ref 4.14–5.80)
RDW: 13.2 % (ref 11.6–15.4)
WBC: 7.9 10*3/uL (ref 3.4–10.8)

## 2021-04-28 LAB — LIPID PANEL
Chol/HDL Ratio: 2.8 ratio (ref 0.0–5.0)
Cholesterol, Total: 122 mg/dL (ref 100–199)
HDL: 44 mg/dL (ref >39)
LDL Chol Calc (NIH): 52 mg/dL (ref 0–99)
Triglycerides: 149 mg/dL (ref 0–149)
VLDL Cholesterol Cal: 26 mg/dL (ref 5–40)

## 2021-04-28 LAB — CMP14+EGFR
ALT: 22 IU/L (ref 0–44)
AST: 16 IU/L (ref 0–40)
Albumin/Globulin Ratio: 2 (ref 1.2–2.2)
Albumin: 4.7 g/dL (ref 3.8–4.8)
Alkaline Phosphatase: 63 IU/L (ref 44–121)
BUN/Creatinine Ratio: 21 (ref 10–24)
BUN: 17 mg/dL (ref 8–27)
Bilirubin Total: 0.6 mg/dL (ref 0.0–1.2)
CO2: 17 mmol/L — ABNORMAL LOW (ref 20–29)
Calcium: 9.1 mg/dL (ref 8.6–10.2)
Chloride: 103 mmol/L (ref 96–106)
Creatinine, Ser: 0.81 mg/dL (ref 0.76–1.27)
Globulin, Total: 2.4 g/dL (ref 1.5–4.5)
Glucose: 144 mg/dL — ABNORMAL HIGH (ref 65–99)
Potassium: 4.5 mmol/L (ref 3.5–5.2)
Sodium: 137 mmol/L (ref 134–144)
Total Protein: 7.1 g/dL (ref 6.0–8.5)
eGFR: 95 mL/min/{1.73_m2} (ref >59)

## 2021-04-28 LAB — BAYER DCA HB A1C WAIVED: HB A1C (BAYER DCA - WAIVED): 6.8 % (ref <7.0)

## 2021-04-28 MED ORDER — CYCLOBENZAPRINE HCL 10 MG PO TABS: 1.0000 | ORAL_TABLET | Freq: Three times a day (TID) | ORAL | 2 refills | Status: DC | PRN

## 2021-04-28 MED ORDER — IBUPROFEN 800 MG PO TABS: 800.0000 mg | ORAL_TABLET | Freq: Three times a day (TID) | ORAL | 2 refills | Status: DC | PRN

## 2021-04-28 MED ORDER — METFORMIN HCL 500 MG PO TABS: 2.0000 | ORAL_TABLET | Freq: Two times a day (BID) | ORAL | 1 refills | Status: DC

## 2021-04-28 MED ORDER — LISINOPRIL 2.5 MG PO TABS: 2.5000 mg | ORAL_TABLET | Freq: Every day | ORAL | 1 refills | Status: DC

## 2021-04-28 NOTE — Progress Notes (Signed)
 Assessment & Plan:  1. Well adult exam Preventive health education provided. Patient is thinking about the AAA u/s. Declined Shingrix.  - Lipid panel - CMP14+EGFR - CBC with Differential/Platelet  2. Advance directive discussed with patient Encouraged patient to bring us a copy of his advanced directives to scan into his chart.   3. DM type 2 with diabetic dyslipidemia (HCC) Last A1c 6.6 on 01/26/2021. Today's A1c is 6.8.  - Diabetes is at goal of A1c < 7. - Medications: continue current medications - Patient is currently taking a statin. Patient is taking an ACE-inhibitor/ARB.   Diabetes Health Maintenance Due  Topic Date Due   OPHTHALMOLOGY EXAM  12/29/2020   HEMOGLOBIN A1C  07/29/2021   FOOT EXAM  01/26/2022    - Lipid panel - CMP14+EGFR - CBC with Differential/Platelet - Bayer DCA Hb A1c Waived - metFORMIN (GLUCOPHAGE) 500 MG tablet; Take 2 tablets (1,000 mg total) by mouth 2 (two) times daily with a meal.  Dispense: 360 tablet; Refill: 1 - Vitamin B12  4. Hypertension associated with diabetes (HCC) Well controlled on current regimen.  - Lipid panel - CMP14+EGFR - CBC with Differential/Platelet - lisinopril (ZESTRIL) 2.5 MG tablet; Take 1 tablet (2.5 mg total) by mouth daily.  Dispense: 90 tablet; Refill: 1  5. Tachycardia Patient reports better control at home. - CMP14+EGFR  6. TMJ (temporomandibular joint disorder) Well controlled on current regimen.  - CMP14+EGFR - cyclobenzaprine (FLEXERIL) 10 MG tablet; Take 1 tablet (10 mg total) by mouth 3 (three) times daily as needed. for muscle spams  Dispense: 30 tablet; Refill: 2  7. Lumbar degenerative disc disease Well controlled on current regimen.  - CMP14+EGFR - ibuprofen (ADVIL) 800 MG tablet; Take 1 tablet (800 mg total) by mouth every 8 (eight) hours as needed.  Dispense: 90 tablet; Refill: 2  8. Benign prostatic hyperplasia without lower urinary tract symptoms Well controlled on current regimen.  - PSA,  total and free  9. Prostate cancer screening - PSA, total and free   Follow-up: Return in about 6 months (around 10/28/2021) for follow-up of chronic medication conditions.   Britney Joyce, MSN, APRN, [name]-C [facility]  Subjective:  Patient ID: Anthony Espinoza, male    DOB: 10/15/1951  Age: 70 y.o. MRN: [id number]  Patient Care Team: Joyce, Britney F, [name] as PCP - General (Family Medicine) Le, Yen Thi Hong, MD as Referring Physician (Optometry)   CC:  Chief Complaint  Patient presents with   Diabetes    3 month follow up    HPI Anthony Espinoza presents for his annual physical.  Occupation: Retired, Marital status: Married, Substance use: None Diet: Healthy, Exercise: Walking Last eye exam: Recent with Dr. Le Last dental exam: Needs to schedule, has not been recently Last colonoscopy: Cologuard negative on 02/07/2021 AAA Screening: Will think about it Hepatitis C Screening: negative on 07/22/2017 PSA: normal in 2020 Immunizations: Flu Vaccine:  not flu season Tdap Vaccine: up to date  Shingrix Vaccine: declined  COVID-19 Vaccine: up to date Pneumonia Vaccine: up to date  Advanced Directives Patient does have advanced directives.  He does not have a copy in the electronic medical record.   DEPRESSION SCREENING PHQ 2/9 Scores 04/28/2021 03/03/2021 01/26/2021 10/05/2020 09/15/2020 08/25/2020 07/28/2020  PHQ - 2 Score 0 0 0 0 0 0 0  PHQ- 9 Score 0 - - - - - 0     Tachycardia: Metoprolol was increased from 25 mg to 50 mg at his   last visit. He reports his heart rate at home is mostly in the 80s and sometimes in the 90s.   Diabetes: Patient presents for follow up of diabetes. Current symptoms include: none. Known diabetic complications: none. Medication compliance: yes. Current diet: in general, a "healthy" diet  . Current exercise: walking. Home blood sugar records: patient does not check sugars. Is he  on ACE inhibitor or angiotensin II receptor blocker?  Yes. Is he on a statin? Yes.    Review of Systems  Constitutional:  Negative for chills, fever, malaise/fatigue and weight loss.  HENT:  Negative for congestion, ear discharge, ear pain, nosebleeds, sinus pain, sore throat and tinnitus.   Eyes:  Negative for blurred vision, double vision, pain, discharge and redness.  Respiratory:  Negative for cough, shortness of breath and wheezing.   Cardiovascular:  Negative for chest pain, palpitations and leg swelling.  Gastrointestinal:  Negative for abdominal pain, constipation, diarrhea, heartburn, nausea and vomiting.  Genitourinary:  Negative for dysuria, frequency and urgency.  Musculoskeletal:  Negative for myalgias.  Skin:  Negative for rash.  Neurological:  Negative for dizziness, seizures, weakness and headaches.  Psychiatric/Behavioral:  Negative for depression, substance abuse and suicidal ideas. The patient is not nervous/anxious.     Current Outpatient Medications:    amLODipine (NORVASC) 10 MG tablet, Take 1 tablet (10 mg total) by mouth daily., Disp: 90 tablet, Rfl: 1   aspirin 81 MG chewable tablet, Chew 81 mg by mouth daily., Disp: , Rfl:    atorvastatin (LIPITOR) 20 MG tablet, TAKE 1 TABLET BY MOUTH ONCE DAILY FOR CHOLESTEROL, Disp: 90 tablet, Rfl: 0   cyclobenzaprine (FLEXERIL) 10 MG tablet, Take 1 tablet by mouth three times daily as needed for muscle spasm, Disp: 30 tablet, Rfl: 2   glipiZIDE (GLUCOTROL XL) 10 MG 24 hr tablet, Take 1 tablet by mouth once daily, Disp: 90 tablet, Rfl: 0   ibuprofen (ADVIL) 800 MG tablet, Take 800 mg by mouth every 8 (eight) hours as needed., Disp: , Rfl:    lisinopril (ZESTRIL) 2.5 MG tablet, Take 1 tablet by mouth once daily, Disp: 30 tablet, Rfl: 1   metFORMIN (GLUCOPHAGE) 500 MG tablet, TAKE 2 TABLETS BY MOUTH TWICE DAILY WITH MEALS, Disp: 360 tablet, Rfl: 0   metoprolol succinate (TOPROL XL) 50 MG 24 hr tablet, Take 1 tablet (50 mg total) by mouth daily., Disp: 90 tablet, Rfl: 1   tamsulosin  (FLOMAX) 0.4 MG CAPS capsule, TAKE 1 CAPSULE BY MOUTH ONCE DAILY AFTER SUPPER, Disp: 90 capsule, Rfl: 1  Allergies  Allergen Reactions   Sulfa Antibiotics Other (See Comments)    Ulcers in mouth   Other     Other reaction(s): GI problems    Past Medical History:  Diagnosis Date   Diabetes mellitus without complication ([place])    Hyperlipidemia    Hypertension     Past Surgical History:  Procedure Laterality Date   BACK SURGERY     x6   NECK SURGERY     SPINAL CORD STIMULATOR IMPLANT      Family History  Problem Relation Age of Onset   Diabetes Mother    Parkinson's disease Brother     Social History   Socioeconomic History   Marital status: Divorced    Spouse name: Not on file   Number of children: Not on file   Years of education: Not on file   Highest education level: Not on file  Occupational History   Not on file  Tobacco  Use   Smoking status: Former    Pack years: 0.00    Types: Cigarettes    Quit date: 10/28/1999    Years since quitting: 21.5   Smokeless tobacco: Never  Vaping Use   Vaping Use: Never used  Substance and Sexual Activity   Alcohol use: Not Currently   Drug use: Never   Sexual activity: Not on file  Other Topics Concern   Not on file  Social History Narrative   Not on file   Social Determinants of Health   Financial Resource Strain: Not on file  Food Insecurity: Not on file  Transportation Needs: Not on file  Physical Activity: Not on file  Stress: Not on file  Social Connections: Not on file  Intimate Partner Violence: Not on file      Objective:    BP 132/79   Pulse (!) 129   Temp 98.3 F (36.8 C) (Temporal)   Ht 6' 1" (1.854 m)   Wt 229 lb 6.4 oz (104.1 kg)   SpO2 95%   BMI 30.27 kg/m   Wt Readings from Last 3 Encounters:  01/26/21 228 lb 9.6 oz (103.7 kg)  10/05/20 232 lb 8 oz (105.5 kg)  09/15/20 232 lb (105.2 kg)    Physical Exam Vitals reviewed.  Constitutional:      General: He is not in acute  distress.    Appearance: Normal appearance. He is obese. He is not ill-appearing, toxic-appearing or diaphoretic.  HENT:     Head: Normocephalic and atraumatic.     Right Ear: Tympanic membrane, ear canal and external ear normal. There is no impacted cerumen.     Left Ear: Tympanic membrane, ear canal and external ear normal. There is no impacted cerumen.     Nose: Nose normal. No congestion or rhinorrhea.     Mouth/Throat:     Mouth: Mucous membranes are moist.     Pharynx: Oropharynx is clear. No oropharyngeal exudate or posterior oropharyngeal erythema.  Eyes:     General: No scleral icterus.       Right eye: No discharge.        Left eye: No discharge.     Conjunctiva/sclera: Conjunctivae normal.     Pupils: Pupils are equal, round, and reactive to light.  Neck:     Vascular: No carotid bruit.  Cardiovascular:     Rate and Rhythm: Normal rate and regular rhythm.     Heart sounds: Normal heart sounds. No murmur heard.   No friction rub. No gallop.  Pulmonary:     Effort: Pulmonary effort is normal. No respiratory distress.     Breath sounds: Normal breath sounds. No stridor. No wheezing, rhonchi or rales.  Abdominal:     General: Abdomen is flat. Bowel sounds are normal. There is no distension.     Palpations: Abdomen is soft. There is no hepatomegaly, splenomegaly or mass.     Tenderness: There is no abdominal tenderness. There is no guarding or rebound.     Hernia: No hernia is present.  Musculoskeletal:        General: Normal range of motion.     Cervical back: Normal range of motion and neck supple. No rigidity. No muscular tenderness.     Right lower leg: No edema.     Left lower leg: No edema.  Lymphadenopathy:     Cervical: No cervical adenopathy.  Skin:    General: Skin is warm and dry.     Capillary Refill: Capillary   refill takes less than 2 seconds.  Neurological:     General: No focal deficit present.     Mental Status: He is alert and oriented to person,  place, and time. Mental status is at baseline.  Psychiatric:        Mood and Affect: Mood normal.        Behavior: Behavior normal.        Thought Content: Thought content normal.        Judgment: Judgment normal.    No results found for: TSH Lab Results  Component Value Date   WBC 8.0 01/26/2021   HGB 15.8 01/26/2021   HCT 45.4 01/26/2021   MCV 90 01/26/2021   PLT 282 01/26/2021   Lab Results  Component Value Date   NA 139 01/26/2021   K 4.9 01/26/2021   CO2 20 01/26/2021   GLUCOSE 200 (H) 01/26/2021   BUN 13 01/26/2021   CREATININE 0.76 01/26/2021   BILITOT 0.4 01/26/2021   ALKPHOS 59 01/26/2021   AST 19 01/26/2021   ALT 16 01/26/2021   PROT 7.0 01/26/2021   ALBUMIN 4.6 01/26/2021   CALCIUM 9.2 01/26/2021   EGFR 97 01/26/2021   Lab Results  Component Value Date   CHOL 120 01/26/2021   Lab Results  Component Value Date   HDL 40 01/26/2021   Lab Results  Component Value Date   LDLCALC 62 01/26/2021   Lab Results  Component Value Date   TRIG 97 01/26/2021   Lab Results  Component Value Date   CHOLHDL 3.0 01/26/2021   Lab Results  Component Value Date   HGBA1C 6.6 01/26/2021

## 2021-04-28 NOTE — Patient Instructions (Addendum)
Bring [place] your advanced directives so we have a copy.  Debrox wax removal kit over the counter. Drops (3-4) should be instilled twice daily x3 days, then the ear flushed with warm water on the 4th day.    Preventive Care 70 Years and Older, Male Preventive care refers to lifestyle choices and visits with your health care provider that can promote health and wellness. This includes: A yearly physical exam. This is also called an annual wellness visit. Regular dental and eye exams. Immunizations. Screening for certain conditions. Healthy lifestyle choices, such as: Eating a healthy diet. Getting regular exercise. Not using drugs or products that contain nicotine and tobacco. Limiting alcohol use. What can I expect for my preventive care visit? Physical exam Your health care provider will check your: Height and weight. These may be used to calculate your BMI (body mass index). BMI is a measurement that tells if you are at a healthy weight. Heart rate and blood pressure. Body temperature. Skin for abnormal spots. Counseling Your health care provider may ask you questions about your: Past medical problems. Family's medical history. Alcohol, tobacco, and drug use. Emotional well-being. Home life and relationship well-being. Sexual activity. Diet, exercise, and sleep habits. History of falls. Memory and ability to understand (cognition). Work and work Statistician. Access to firearms. What immunizations do I need?  Vaccines are usually given at various ages, according to a schedule. Your health care provider will recommend vaccines for you based on your age, medicalhistory, and lifestyle or other factors, such as travel or where you work. What tests do I need? Blood tests Lipid and cholesterol levels. These may be checked every 5 years, or more often depending on your overall health. Hepatitis C test. Hepatitis B test. Screening Lung cancer screening. You may have this screening  every year starting at age 66 if you have a 30-pack-year history of smoking and currently smoke or have quit within the past 15 years. Colorectal cancer screening. All adults should have this screening starting at age 80 and continuing until age 51. Your health care provider may recommend screening at age 31 if you are at increased risk. You will have tests every 1-10 years, depending on your results and the type of screening test. Prostate cancer screening. Recommendations will vary depending on your family history and other risks. Genital exam to check for testicular cancer or hernias. Diabetes screening. This is done by checking your blood sugar (glucose) after you have not eaten for a while (fasting). You may have this done every 1-3 years. Abdominal aortic aneurysm (AAA) screening. You may need this if you are a current or former smoker. STD (sexually transmitted disease) testing, if you are at risk. Follow these instructions at home: Eating and drinking  Eat a diet that includes fresh fruits and vegetables, whole grains, lean protein, and low-fat dairy products. Limit your intake of foods with high amounts of sugar, saturated fats, and salt. Take vitamin and mineral supplements as recommended by your health care provider. Do not drink alcohol if your health care provider tells you not to drink. If you drink alcohol: Limit how much you have to 0-2 drinks a day. Be aware of how much alcohol is in your drink. In the [place] one drink equals one 12 oz bottle of beer (355 mL), one 5 oz glass of wine (148 mL), or one 1 oz glass of hard liquor (44 mL).  Lifestyle Take daily care of your teeth and gums. Brush your teeth every morning  and night with fluoride toothpaste. Floss one time each day. Stay active. Exercise for at least 30 minutes 5 or more days each week. Do not use any products that contain nicotine or tobacco, such as cigarettes, e-cigarettes, and chewing tobacco. If you need help  quitting, ask your health care provider. Do not use drugs. If you are sexually active, practice safe sex. Use a condom or other form of protection to prevent STIs (sexually transmitted infections). Talk with your health care provider about taking a low-dose aspirin or statin. Find healthy ways to cope with stress, such as: Meditation, yoga, or listening to music. Journaling. Talking to a trusted person. Spending time with friends and family. Safety Always wear your seat belt while driving or riding in a vehicle. Do not drive: If you have been drinking alcohol. Do not ride with someone who has been drinking. When you are tired or distracted. While texting. Wear a helmet and other protective equipment during sports activities. If you have firearms in your house, make sure you follow all gun safety procedures. What's next? Visit your health care provider once a year for an annual wellness visit. Ask your health care provider how often you should have your eyes and teeth checked. Stay up to date on all vaccines. This information is not intended to replace advice given to you by your health care provider. Make sure you discuss any questions you have with your healthcare provider. Document Revised: 07/28/2019 Document Reviewed: 10/23/2018 Elsevier Patient Education  2022 [employer].

## 2021-05-02 LAB — SPECIMEN STATUS REPORT

## 2021-05-02 LAB — VITAMIN B12: Vitamin B-12: 458 pg/mL (ref 232–1245)

## 2021-05-02 LAB — PSA, TOTAL AND FREE
PSA, Free Pct: 20.8 %
PSA, Free: 0.54 ng/mL
Prostate Specific Ag, Serum: 2.6 ng/mL (ref 0.0–4.0)

## 2021-07-10 DIAGNOSIS — H6122 Impacted cerumen, left ear: Secondary | ICD-10-CM | POA: Diagnosis not present

## 2021-07-10 DIAGNOSIS — H6120 Impacted cerumen, unspecified ear: Secondary | ICD-10-CM | POA: Diagnosis not present

## 2021-07-18 ENCOUNTER — Other Ambulatory Visit: Payer: Self-pay | Admitting: Family Medicine

## 2021-07-18 DIAGNOSIS — E785 Hyperlipidemia, unspecified: Secondary | ICD-10-CM

## 2021-07-18 DIAGNOSIS — E1169 Type 2 diabetes mellitus with other specified complication: Secondary | ICD-10-CM

## 2021-07-20 ENCOUNTER — Other Ambulatory Visit: Payer: Self-pay | Admitting: Family Medicine

## 2021-07-20 DIAGNOSIS — R000 Tachycardia, unspecified: Secondary | ICD-10-CM

## 2021-08-09 ENCOUNTER — Other Ambulatory Visit: Payer: Self-pay | Admitting: Family Medicine

## 2021-08-09 DIAGNOSIS — M26609 Unspecified temporomandibular joint disorder, unspecified side: Secondary | ICD-10-CM

## 2021-08-29 ENCOUNTER — Other Ambulatory Visit: Payer: Self-pay | Admitting: Family Medicine

## 2021-08-29 DIAGNOSIS — I152 Hypertension secondary to endocrine disorders: Secondary | ICD-10-CM

## 2021-08-29 DIAGNOSIS — N400 Enlarged prostate without lower urinary tract symptoms: Secondary | ICD-10-CM

## 2021-08-29 DIAGNOSIS — E1159 Type 2 diabetes mellitus with other circulatory complications: Secondary | ICD-10-CM

## 2021-09-11 ENCOUNTER — Other Ambulatory Visit: Payer: Self-pay | Admitting: Nurse Practitioner

## 2021-09-11 DIAGNOSIS — M26609 Unspecified temporomandibular joint disorder, unspecified side: Secondary | ICD-10-CM

## 2021-09-12 ENCOUNTER — Telehealth: Payer: Self-pay | Admitting: *Deleted

## 2021-09-12 NOTE — Telephone Encounter (Signed)
Maeola Sarah KeyThurnell Garbe - PA Case ID: [id number] - Rx #: [id number] Need help? Call us at [phone] Status Sent to [facility] Cyclobenzaprine HCl 10MG  tablets Form [employer] Electronic [place] Form [phone] NCPDP) Original Claim Info [phone]

## 2021-09-12 NOTE — Telephone Encounter (Signed)
Date: 09/12/2021 [place], [place] [address] Member Name: RIGBY LEONHARDT Member ID Number: [id number] Thank you for trusting your Medicare prescription drug coverage to [employer] ([employer]). As our member, we want to help you get the most value from your prescription  drug coverage and help you understand how your coverage works. As a member of [employer] ([employer]), we are pleased to inform you that,  upon review of the information provided by you or your doctor, we have approved the requested  coverage for the following prescription drug(s): CYCLOBENZAPRINE HCL Tablet Type of coverage approved: Prior Authorization This approval authorizes your coverage from 11/12/2020 - 12/11/2021  Approval faxed to [facility]

## 2021-10-06 DIAGNOSIS — J0100 Acute maxillary sinusitis, unspecified: Secondary | ICD-10-CM | POA: Diagnosis not present

## 2021-10-06 DIAGNOSIS — R051 Acute cough: Secondary | ICD-10-CM | POA: Diagnosis not present

## 2021-10-13 ENCOUNTER — Other Ambulatory Visit: Payer: Self-pay | Admitting: Family Medicine

## 2021-10-13 DIAGNOSIS — M5136 Other intervertebral disc degeneration, lumbar region: Secondary | ICD-10-CM

## 2021-10-18 ENCOUNTER — Other Ambulatory Visit: Payer: Self-pay | Admitting: Family Medicine

## 2021-10-18 DIAGNOSIS — E1169 Type 2 diabetes mellitus with other specified complication: Secondary | ICD-10-CM

## 2021-10-31 ENCOUNTER — Encounter: Payer: Self-pay | Admitting: Family Medicine

## 2021-10-31 ENCOUNTER — Ambulatory Visit (INDEPENDENT_AMBULATORY_CARE_PROVIDER_SITE_OTHER): Payer: Medicare HMO | Admitting: Family Medicine

## 2021-10-31 VITALS — BP 142/86 | HR 118 | Temp 98.2°F | Ht 73.0 in | Wt 227.2 lb

## 2021-10-31 DIAGNOSIS — I152 Hypertension secondary to endocrine disorders: Secondary | ICD-10-CM

## 2021-10-31 DIAGNOSIS — E1169 Type 2 diabetes mellitus with other specified complication: Secondary | ICD-10-CM

## 2021-10-31 DIAGNOSIS — E785 Hyperlipidemia, unspecified: Secondary | ICD-10-CM

## 2021-10-31 DIAGNOSIS — Z87891 Personal history of nicotine dependence: Secondary | ICD-10-CM

## 2021-10-31 DIAGNOSIS — R000 Tachycardia, unspecified: Secondary | ICD-10-CM | POA: Diagnosis not present

## 2021-10-31 DIAGNOSIS — E1159 Type 2 diabetes mellitus with other circulatory complications: Secondary | ICD-10-CM

## 2021-10-31 DIAGNOSIS — N400 Enlarged prostate without lower urinary tract symptoms: Secondary | ICD-10-CM | POA: Diagnosis not present

## 2021-10-31 DIAGNOSIS — Z7189 Other specified counseling: Secondary | ICD-10-CM | POA: Diagnosis not present

## 2021-10-31 LAB — BAYER DCA HB A1C WAIVED: HB A1C (BAYER DCA - WAIVED): 6.8 % — ABNORMAL HIGH (ref 4.8–5.6)

## 2021-10-31 MED ORDER — LISINOPRIL 2.5 MG PO TABS: 2.5000 mg | ORAL_TABLET | Freq: Every day | ORAL | 1 refills | Status: DC

## 2021-10-31 MED ORDER — ATORVASTATIN CALCIUM 20 MG PO TABS: 20.0000 mg | ORAL_TABLET | Freq: Every day | ORAL | 1 refills | Status: DC

## 2021-10-31 MED ORDER — AMLODIPINE BESYLATE 10 MG PO TABS: 10.0000 mg | ORAL_TABLET | Freq: Every day | ORAL | 1 refills | Status: DC

## 2021-10-31 MED ORDER — METOPROLOL SUCCINATE ER 50 MG PO TB24: 50.0000 mg | ORAL_TABLET | Freq: Every day | ORAL | 1 refills | Status: DC

## 2021-10-31 MED ORDER — TAMSULOSIN HCL 0.4 MG PO CAPS: ORAL_CAPSULE | ORAL | 1 refills | Status: DC

## 2021-10-31 NOTE — Progress Notes (Signed)
Assessment & Plan:  1. DM type 2 with diabetic dyslipidemia ([place]) Lab Results  Component Value Date   HGBA1C 6.8 (H) 10/31/2021   HGBA1C 6.8 04/28/2021   HGBA1C 6.6 01/26/2021    - Diabetes is at goal of A1c < 7. - Medications: continue current medications - Patient is currently taking a statin. Patient is taking an ACE-inhibitor/ARB.  - Instruction/counseling given: discussed diet  Diabetes Health Maintenance Due  Topic Date Due   OPHTHALMOLOGY EXAM  12/29/2020   FOOT EXAM  01/26/2022   HEMOGLOBIN A1C  05/01/2022    Lab Results  Component Value Date   LABMICR 3.0 10/28/2019   - Lipid panel - CBC with Differential/Platelet - CMP14+EGFR - Bayer DCA Hb A1c Waived - atorvastatin (LIPITOR) 20 MG tablet; Take 1 tablet (20 mg total) by mouth daily. for cholesterol.  Dispense: 90 tablet; Refill: 1  2. Hypertension associated with diabetes ([place]) Well controlled on current regimen.  - Lipid panel - CBC with Differential/Platelet - CMP14+EGFR - amLODipine (NORVASC) 10 MG tablet; Take 1 tablet (10 mg total) by mouth daily.  Dispense: 90 tablet; Refill: 1 - lisinopril (ZESTRIL) 2.5 MG tablet; Take 1 tablet (2.5 mg total) by mouth daily.  Dispense: 90 tablet; Refill: 1  3. Tachycardia - CMP14+EGFR - metoprolol succinate (TOPROL XL) 50 MG 24 hr tablet; Take 1 tablet (50 mg total) by mouth daily.  Dispense: 90 tablet; Refill: 1  4. Benign prostatic hyperplasia without lower urinary tract symptoms Well controlled on current regimen.  - tamsulosin (FLOMAX) 0.4 MG CAPS capsule; 1 capsule daily  Dispense: 90 capsule; Refill: 1  5. ACP (advance care planning) Documents provided to patient for completion.  6. History of tobacco use Discussed screening for AAA. Education provided on AAAs to help patient make a decision.   Return in about 6 months (around 05/01/2022) for annual physical.  Hendricks Limes, MSN, APRN, [name]-C [facility]  Subjective:    Patient  ID: Anthony Espinoza, male    DOB: 1951-08-27, 70 y.o.   MRN: [id number]  Patient Care Team: [name], [name] as PCP - General (Family Medicine) Harlen Labs, MD as Referring Physician (Optometry)   Chief Complaint:  Chief Complaint  Patient presents with   Diabetes   Hypertension    6 month follow up of chronic medical conditions     HPI: Anthony Espinoza is a 70 y.o. male presenting on 10/31/2021 for Diabetes and Hypertension (6 month follow up of chronic medical conditions )  Diabetes: Patient presents for follow up of diabetes. Current symptoms include: none. Known diabetic complications: none. Medication compliance: yes. Current diet: in general, a "healthy" diet  . Current exercise: walking. Home blood sugar records: patient does not check sugars. Is he  on ACE inhibitor or angiotensin II receptor blocker? Yes (Lisinopril). Is he on a statin? Yes (Atorvastatin).   Tachycardia: resting heart rate at home under 100. He does not feel his heart racing.  Hypertension: patient is checking his BP at home and reports good readings.   New complaints: None  Social history:  Relevant past medical, surgical, family and social history reviewed and updated as indicated. Interim medical history since our last visit reviewed.  Allergies and medications reviewed and updated.  DATA REVIEWED: CHART IN EPIC  ROS: Negative unless specifically indicated above in HPI.    Current Outpatient Medications:    amLODipine (NORVASC) 10 MG tablet, Take 1 tablet by mouth once daily, Disp: 90  tablet, Rfl: 0   aspirin 81 MG chewable tablet, Chew 81 mg by mouth daily., Disp: , Rfl:    atorvastatin (LIPITOR) 20 MG tablet, TAKE 1 TABLET BY MOUTH ONCE DAILY FOR CHOLESTEROL, Disp: 90 tablet, Rfl: 0   cyclobenzaprine (FLEXERIL) 10 MG tablet, Take 1 tablet by mouth three times daily as needed for muscle spasm, Disp: 60 tablet, Rfl: 5   glipiZIDE (GLUCOTROL XL) 10 MG 24 hr tablet, Take 1 tablet by mouth  once daily, Disp: 90 tablet, Rfl: 0   ibuprofen (ADVIL) 800 MG tablet, TAKE 1 TABLET BY MOUTH EVERY 8 HOURS AS NEEDED, Disp: 90 tablet, Rfl: 2   lisinopril (ZESTRIL) 2.5 MG tablet, Take 1 tablet (2.5 mg total) by mouth daily., Disp: 90 tablet, Rfl: 1   metFORMIN (GLUCOPHAGE) 500 MG tablet, Take 2 tablets (1,000 mg total) by mouth 2 (two) times daily with a meal., Disp: 360 tablet, Rfl: 1   metoprolol succinate (TOPROL XL) 50 MG 24 hr tablet, Take 1 tablet (50 mg total) by mouth daily., Disp: 90 tablet, Rfl: 1   tamsulosin (FLOMAX) 0.4 MG CAPS capsule, TAKE 1 CAPSULE BY MOUTH ONCE DAILY AFTER SUPPER, Disp: 90 capsule, Rfl: 0   Allergies  Allergen Reactions   Sulfa Antibiotics Other (See Comments)    Ulcers in mouth Ulcers in mouth Ulcers in mouth   Other     Other reaction(s): GI problems   Past Medical History:  Diagnosis Date   Diabetes mellitus without complication ([place])    Hyperlipidemia    Hypertension     Past Surgical History:  Procedure Laterality Date   BACK SURGERY     x6   NECK SURGERY     SPINAL CORD STIMULATOR IMPLANT      Social History   Socioeconomic History   Marital status: Married    Spouse name: Not on file   Number of children: Not on file   Years of education: Not on file   Highest education level: Not on file  Occupational History   Occupation: Retired  Tobacco Use   Smoking status: Former    Types: Cigarettes    Quit date: 10/28/1999    Years since quitting: 22.0   Smokeless tobacco: Never  Vaping Use   Vaping Use: Never used  Substance and Sexual Activity   Alcohol use: Not Currently   Drug use: Never   Sexual activity: Not on file  Other Topics Concern   Not on file  Social History Narrative   Not on file   Social Determinants of Health   Financial Resource Strain: Not on file  Food Insecurity: Not on file  Transportation Needs: Not on file  Physical Activity: Not on file  Stress: Not on file  Social Connections: Not on file   Intimate Partner Violence: Not on file        Objective:    BP (!) 142/86    Pulse (!) 118    Temp 98.2 F (36.8 C) (Temporal)    Ht 6' 1"  (1.854 m)    Wt 227 lb 3.2 oz (103.1 kg)    SpO2 97%    BMI 29.98 kg/m   Wt Readings from Last 3 Encounters:  10/31/21 227 lb 3.2 oz (103.1 kg)  04/28/21 229 lb 6.4 oz (104.1 kg)  01/26/21 228 lb 9.6 oz (103.7 kg)    Physical Exam Vitals reviewed.  Constitutional:      General: He is not in acute distress.    Appearance: Normal  appearance. He is overweight. He is not ill-appearing, toxic-appearing or diaphoretic.  HENT:     Head: Normocephalic and atraumatic.  Eyes:     General: No scleral icterus.       Right eye: No discharge.        Left eye: No discharge.     Conjunctiva/sclera: Conjunctivae normal.  Cardiovascular:     Rate and Rhythm: Normal rate and regular rhythm.     Heart sounds: Normal heart sounds. No murmur heard.   No friction rub. No gallop.  Pulmonary:     Effort: Pulmonary effort is normal. No respiratory distress.     Breath sounds: Normal breath sounds. No stridor. No wheezing, rhonchi or rales.  Musculoskeletal:        General: Normal range of motion.     Cervical back: Normal range of motion.  Skin:    General: Skin is warm and dry.  Neurological:     Mental Status: He is alert and oriented to person, place, and time. Mental status is at baseline.  Psychiatric:        Mood and Affect: Mood normal.        Behavior: Behavior normal.        Thought Content: Thought content normal.        Judgment: Judgment normal.   Diabetic Foot Exam - Simple   No data filed     No results found for: TSH Lab Results  Component Value Date   WBC 7.9 04/28/2021   HGB 15.7 04/28/2021   HCT 45.3 04/28/2021   MCV 91 04/28/2021   PLT 260 04/28/2021   Lab Results  Component Value Date   NA 137 04/28/2021   K 4.5 04/28/2021   CO2 17 (L) 04/28/2021   GLUCOSE 144 (H) 04/28/2021   BUN 17 04/28/2021   CREATININE 0.81  04/28/2021   BILITOT 0.6 04/28/2021   ALKPHOS 63 04/28/2021   AST 16 04/28/2021   ALT 22 04/28/2021   PROT 7.1 04/28/2021   ALBUMIN 4.7 04/28/2021   CALCIUM 9.1 04/28/2021   EGFR 95 04/28/2021   Lab Results  Component Value Date   CHOL 122 04/28/2021   Lab Results  Component Value Date   HDL 44 04/28/2021   Lab Results  Component Value Date   LDLCALC 52 04/28/2021   Lab Results  Component Value Date   TRIG 149 04/28/2021   Lab Results  Component Value Date   CHOLHDL 2.8 04/28/2021   Lab Results  Component Value Date   HGBA1C 6.8 04/28/2021

## 2021-10-31 NOTE — Patient Instructions (Signed)
Please bring me a copy of your advance directives.

## 2021-11-01 LAB — CBC WITH DIFFERENTIAL/PLATELET
Basophils Absolute: 0 10*3/uL (ref 0.0–0.2)
Basos: 1 %
EOS (ABSOLUTE): 0.3 10*3/uL (ref 0.0–0.4)
Eos: 5 %
Hematocrit: 45.6 % (ref 37.5–51.0)
Hemoglobin: 15.7 g/dL (ref 13.0–17.7)
Immature Grans (Abs): 0 10*3/uL (ref 0.0–0.1)
Immature Granulocytes: 0 %
Lymphocytes Absolute: 2.1 10*3/uL (ref 0.7–3.1)
Lymphs: 31 %
MCH: 30.8 pg (ref 26.6–33.0)
MCHC: 34.4 g/dL (ref 31.5–35.7)
MCV: 90 fL (ref 79–97)
Monocytes Absolute: 0.7 10*3/uL (ref 0.1–0.9)
Monocytes: 10 %
Neutrophils Absolute: 3.7 10*3/uL (ref 1.4–7.0)
Neutrophils: 53 %
Platelets: 302 10*3/uL (ref 150–450)
RBC: 5.09 x10E6/uL (ref 4.14–5.80)
RDW: 12.6 % (ref 11.6–15.4)
WBC: 6.8 10*3/uL (ref 3.4–10.8)

## 2021-11-01 LAB — CMP14+EGFR
ALT: 21 IU/L (ref 0–44)
AST: 21 IU/L (ref 0–40)
Albumin/Globulin Ratio: 1.8 (ref 1.2–2.2)
Albumin: 4.7 g/dL (ref 3.8–4.8)
Alkaline Phosphatase: 64 IU/L (ref 44–121)
BUN/Creatinine Ratio: 17 (ref 10–24)
BUN: 14 mg/dL (ref 8–27)
Bilirubin Total: 0.5 mg/dL (ref 0.0–1.2)
CO2: 19 mmol/L — ABNORMAL LOW (ref 20–29)
Calcium: 9.4 mg/dL (ref 8.6–10.2)
Chloride: 103 mmol/L (ref 96–106)
Creatinine, Ser: 0.83 mg/dL (ref 0.76–1.27)
Globulin, Total: 2.6 g/dL (ref 1.5–4.5)
Glucose: 158 mg/dL — ABNORMAL HIGH (ref 70–99)
Potassium: 4.3 mmol/L (ref 3.5–5.2)
Sodium: 139 mmol/L (ref 134–144)
Total Protein: 7.3 g/dL (ref 6.0–8.5)
eGFR: 94 mL/min/{1.73_m2} (ref >59)

## 2021-11-01 LAB — LIPID PANEL
Chol/HDL Ratio: 2.8 ratio (ref 0.0–5.0)
Cholesterol, Total: 112 mg/dL (ref 100–199)
HDL: 40 mg/dL (ref >39)
LDL Chol Calc (NIH): 56 mg/dL (ref 0–99)
Triglycerides: 78 mg/dL (ref 0–149)
VLDL Cholesterol Cal: 16 mg/dL (ref 5–40)

## 2021-11-06 ENCOUNTER — Encounter: Payer: Self-pay | Admitting: Family Medicine

## 2022-01-15 ENCOUNTER — Other Ambulatory Visit: Payer: Self-pay | Admitting: Family Medicine

## 2022-01-18 DIAGNOSIS — L989 Disorder of the skin and subcutaneous tissue, unspecified: Secondary | ICD-10-CM | POA: Diagnosis not present

## 2022-01-23 ENCOUNTER — Other Ambulatory Visit: Payer: Self-pay | Admitting: Family Medicine

## 2022-01-23 DIAGNOSIS — C44519 Basal cell carcinoma of skin of other part of trunk: Secondary | ICD-10-CM | POA: Diagnosis not present

## 2022-01-23 DIAGNOSIS — E1169 Type 2 diabetes mellitus with other specified complication: Secondary | ICD-10-CM

## 2022-01-30 DIAGNOSIS — C44519 Basal cell carcinoma of skin of other part of trunk: Secondary | ICD-10-CM

## 2022-01-30 HISTORY — DX: Basal cell carcinoma of skin of other part of trunk: C44.519

## 2022-02-22 DIAGNOSIS — L988 Other specified disorders of the skin and subcutaneous tissue: Secondary | ICD-10-CM | POA: Diagnosis not present

## 2022-02-22 DIAGNOSIS — C44519 Basal cell carcinoma of skin of other part of trunk: Secondary | ICD-10-CM | POA: Diagnosis not present

## 2022-02-28 ENCOUNTER — Telehealth: Payer: Self-pay | Admitting: Family Medicine

## 2022-02-28 NOTE — Telephone Encounter (Signed)
Left message for patient to call back and schedule Medicare Annual Wellness Visit (AWV) to be completed by video or phone.  ? ?Last AWV: 03/03/2021 ? ?Please schedule at anytime with [place] ? ?45 minute appointment ? ?Any questions, please contact me at [phone]  ?  ?  ?

## 2022-03-14 ENCOUNTER — Ambulatory Visit (INDEPENDENT_AMBULATORY_CARE_PROVIDER_SITE_OTHER): Payer: Medicare HMO

## 2022-03-14 VITALS — Ht 73.0 in | Wt 227.0 lb

## 2022-03-14 DIAGNOSIS — Z0000 Encounter for general adult medical examination without abnormal findings: Secondary | ICD-10-CM | POA: Diagnosis not present

## 2022-03-14 NOTE — Patient Instructions (Signed)
Anthony Espinoza , ?Thank you for taking time to come for your Medicare Wellness Visit. I appreciate your ongoing commitment to your health goals. Please review the following plan we discussed and let me know if I can assist you in the future.  ? ?Screening recommendations/referrals: ?Colonoscopy: Cologuard 02/07/2021. Repeat every 3 years.  ? ?Recommended yearly ophthalmology/optometry visit for glaucoma screening and checkup ?Recommended yearly dental visit for hygiene and checkup ? ?Vaccinations: ?Influenza vaccine: Done 08/31/2021 Repeat annually ? ?Pneumococcal vaccine: Done 06/14/2016, 09/06/2017 and 08/25/2019. ?Tdap vaccine: Done 01/26/2021 Repeat in 10 years ? ?Shingles vaccine: Discussed.   ?Covid-19: Done 02/17/2020, 03/15/2020, 09/22/2020 and 02/13/2021. ? ?Advanced directives: Copies in chart. ? ?Conditions/risks identified: KEEP UP THE GOOD WORK!! ? ?Next appointment: Follow up in one year for your annual wellness visit. 03/20/2023 @ 8:15 am. ? ?Preventive Care 39 Years and Older, Male ? ?Preventive care refers to lifestyle choices and visits with your health care provider that can promote health and wellness. ?What does preventive care include? ?A yearly physical exam. This is also called an annual well check. ?Dental exams once or twice a year. ?Routine eye exams. Ask your health care provider how often you should have your eyes checked. ?Personal lifestyle choices, including: ?Daily care of your teeth and gums. ?Regular physical activity. ?Eating a healthy diet. ?Avoiding tobacco and drug use. ?Limiting alcohol use. ?Practicing safe sex. ?Taking low doses of aspirin every day. ?Taking vitamin and mineral supplements as recommended by your health care provider. ?What happens during an annual well check? ?The services and screenings done by your health care provider during your annual well check will depend on your age, overall health, lifestyle risk factors, and family history of disease. ?Counseling  ?Your health  care provider may ask you questions about your: ?Alcohol use. ?Tobacco use. ?Drug use. ?Emotional well-being. ?Home and relationship well-being. ?Sexual activity. ?Eating habits. ?History of falls. ?Memory and ability to understand (cognition). ?Work and work Statistician. ?Screening  ?You may have the following tests or measurements: ?Height, weight, and BMI. ?Blood pressure. ?Lipid and cholesterol levels. These may be checked every 5 years, or more frequently if you are over 81 years old. ?Skin check. ?Lung cancer screening. You may have this screening every year starting at age 77 if you have a 30-pack-year history of smoking and currently smoke or have quit within the past 15 years. ?Fecal occult blood test (FOBT) of the stool. You may have this test every year starting at age 50. ?Flexible sigmoidoscopy or colonoscopy. You may have a sigmoidoscopy every 5 years or a colonoscopy every 10 years starting at age 72. ?Prostate cancer screening. Recommendations will vary depending on your family history and other risks. ?Hepatitis C blood test. ?Hepatitis B blood test. ?Sexually transmitted disease (STD) testing. ?Diabetes screening. This is done by checking your blood sugar (glucose) after you have not eaten for a while (fasting). You may have this done every 1-3 years. ?Abdominal aortic aneurysm (AAA) screening. You may need this if you are a current or former smoker. ?Osteoporosis. You may be screened starting at age 52 if you are at high risk. ?Talk with your health care provider about your test results, treatment options, and if necessary, the need for more tests. ?Vaccines  ?Your health care provider may recommend certain vaccines, such as: ?Influenza vaccine. This is recommended every year. ?Tetanus, diphtheria, and acellular pertussis (Tdap, Td) vaccine. You may need a Td booster every 10 years. ?Zoster vaccine. You may need this after age  60. ?Pneumococcal 13-valent conjugate (PCV13) vaccine. One dose is  recommended after age 82. ?Pneumococcal polysaccharide (PPSV23) vaccine. One dose is recommended after age 31. ?Talk to your health care provider about which screenings and vaccines you need and how often you need them. ?This information is not intended to replace advice given to you by your health care provider. Make sure you discuss any questions you have with your health care provider. ?Document Released: 11/25/2015 Document Revised: 07/18/2016 Document Reviewed: 08/30/2015 ?Elsevier Interactive Patient Education ? 2017 [employer]. ? ?Fall Prevention in the Home ?Falls can cause injuries. They can happen to people of all ages. There are many things you can do to make your home safe and to help prevent falls. ?What can I do on the outside of my home? ?Regularly fix the edges of walkways and driveways and fix any cracks. ?Remove anything that might make you trip as you walk through a door, such as a raised step or threshold. ?Trim any bushes or trees on the path to your home. ?Use bright outdoor lighting. ?Clear any walking paths of anything that might make someone trip, such as rocks or tools. ?Regularly check to see if handrails are loose or broken. Make sure that both sides of any steps have handrails. ?Any raised decks and porches should have guardrails on the edges. ?Have any leaves, snow, or ice cleared regularly. ?Use sand or salt on walking paths during winter. ?Clean up any spills in your garage right away. This includes oil or grease spills. ?What can I do in the bathroom? ?Use night lights. ?Install grab bars by the toilet and in the tub and shower. Do not use towel bars as grab bars. ?Use non-skid mats or decals in the tub or shower. ?If you need to sit down in the shower, use a plastic, non-slip stool. ?Keep the floor dry. Clean up any water that spills on the floor as soon as it happens. ?Remove soap buildup in the tub or shower regularly. ?Attach bath mats securely with double-sided non-slip rug  tape. ?Do not have throw rugs and other things on the floor that can make you trip. ?What can I do in the bedroom? ?Use night lights. ?Make sure that you have a light by your bed that is easy to reach. ?Do not use any sheets or blankets that are too big for your bed. They should not hang down onto the floor. ?Have a firm chair that has side arms. You can use this for support while you get dressed. ?Do not have throw rugs and other things on the floor that can make you trip. ?What can I do in the kitchen? ?Clean up any spills right away. ?Avoid walking on wet floors. ?Keep items that you use a lot in easy-to-reach places. ?If you need to reach something above you, use a strong step stool that has a grab bar. ?Keep electrical cords out of the way. ?Do not use floor polish or wax that makes floors slippery. If you must use wax, use non-skid floor wax. ?Do not have throw rugs and other things on the floor that can make you trip. ?What can I do with my stairs? ?Do not leave any items on the stairs. ?Make sure that there are handrails on both sides of the stairs and use them. Fix handrails that are broken or loose. Make sure that handrails are as long as the stairways. ?Check any carpeting to make sure that it is firmly attached to the stairs. Fix  any carpet that is loose or worn. ?Avoid having throw rugs at the top or bottom of the stairs. If you do have throw rugs, attach them to the floor with carpet tape. ?Make sure that you have a light switch at the top of the stairs and the bottom of the stairs. If you do not have them, ask someone to add them for you. ?What else can I do to help prevent falls? ?Wear shoes that: ?Do not have high heels. ?Have rubber bottoms. ?Are comfortable and fit you well. ?Are closed at the toe. Do not wear sandals. ?If you use a stepladder: ?Make sure that it is fully opened. Do not climb a closed stepladder. ?Make sure that both sides of the stepladder are locked into place. ?Ask someone to  hold it for you, if possible. ?Clearly mark and make sure that you can see: ?Any grab bars or handrails. ?First and last steps. ?Where the edge of each step is. ?Use tools that help you move around (mobility a

## 2022-03-14 NOTE — Progress Notes (Signed)
? ?Subjective:  ? Anthony Espinoza is a 71 y.o. male who presents for Medicare Annual/Subsequent preventive examination. ?Virtual Visit via Telephone Note ? ?I connected with  Anthony Espinoza on 03/14/22 at  8:15 AM EDT by telephone and verified that I am speaking with the correct person using two identifiers. ? ?Location: ?Patient: HOME ?Provider: [facility] ?Persons participating in the virtual visit: patient/Nurse Health Advisor ?  ?I discussed the limitations, risks, security and privacy concerns of performing an evaluation and management service by telephone and the availability of in person appointments. The patient expressed understanding and agreed to proceed. ? ?Interactive audio and video telecommunications were attempted between this nurse and patient, however failed, due to patient having technical difficulties OR patient did not have access to video capability.  We continued and completed visit with audio only. ? ?Some vital signs may be absent or patient reported.  ? ?Anthony Driver, Anthony Espinoza ? ?Review of Systems    ? ?Cardiac Risk Factors include: advanced age (>42mn, >>39women);diabetes mellitus;hypertension;male gender;sedentary lifestyle ? ?   ?Objective:  ?  ?Today's Vitals  ? 03/14/22 0820  ?Weight: 227 lb (103 kg)  ?Height: '6\' 1"'$  (1.854 m)  ? ?Body mass index is 29.95 kg/m?. ? ? ?  03/14/2022  ?  8:27 AM 03/03/2021  ? 10:03 AM 03/02/2020  ? 10:29 AM  ?Advanced Directives  ?Does Patient Have a Medical Advance Directive? Yes Yes Yes  ?Type of AParamedicof [place] will Living will Living will  ?Does patient want to make changes to medical advance directive?  No - Patient declined No - Patient declined  ?Copy of [place] Chart? Yes - validated most recent copy scanned in chart (See row information)    ?Would patient like information on creating a medical advance directive?   No - Patient declined  ? ? ?Current Medications (verified) ?Outpatient Encounter  Medications as of 03/14/2022  ?Medication Sig  ? amLODipine (NORVASC) 10 MG tablet Take 1 tablet (10 mg total) by mouth daily.  ? aspirin 81 MG chewable tablet Chew 81 mg by mouth daily.  ? atorvastatin (LIPITOR) 20 MG tablet Take 1 tablet (20 mg total) by mouth daily. for cholesterol.  ? cyclobenzaprine (FLEXERIL) 10 MG tablet Take 1 tablet by mouth three times daily as needed for muscle spasm  ? glipiZIDE (GLUCOTROL XL) 10 MG 24 hr tablet Take 1 tablet by mouth once daily  ? ibuprofen (ADVIL) 800 MG tablet TAKE 1 TABLET BY MOUTH EVERY 8 HOURS AS NEEDED  ? lisinopril (ZESTRIL) 2.5 MG tablet Take 1 tablet (2.5 mg total) by mouth daily.  ? metFORMIN (GLUCOPHAGE) 500 MG tablet TAKE 2 TABLETS BY MOUTH TWICE DAILY WITH A MEAL  ? metoprolol succinate (TOPROL XL) 50 MG 24 hr tablet Take 1 tablet (50 mg total) by mouth daily.  ? tamsulosin (FLOMAX) 0.4 MG CAPS capsule 1 capsule daily  ? ?No facility-administered encounter medications on file as of 03/14/2022.  ? ? ?Allergies (verified) ?Sulfa antibiotics and Other  ? ?History: ?Past Medical History:  ?Diagnosis Date  ? [place]   ? Hyperlipidemia   ? Hypertension   ? ?Past Surgical History:  ?Procedure Laterality Date  ? BACK SURGERY    ? x6  ? NECK SURGERY    ? SPINAL CORD STIMULATOR IMPLANT    ? ?Family History  ?Problem Relation Age of Onset  ? Diabetes Mother   ? Parkinson's disease Brother   ? ?  Social History  ? ?Socioeconomic History  ? Marital status: Married  ?  Spouse name: Not on file  ? Number of children: Not on file  ? Years of education: Not on file  ? Highest education level: Not on file  ?Occupational History  ? Occupation: Retired  ?Tobacco Use  ? Smoking status: Former  ?  Types: Cigarettes  ?  Quit date: 10/28/1999  ?  Years since quitting: 22.3  ? Smokeless tobacco: Never  ?Vaping Use  ? Vaping Use: Never used  ?Substance and Sexual Activity  ? Alcohol use: Not Currently  ? Drug use: Never  ? Sexual activity: Not on file   ?Other Topics Concern  ? Not on file  ?Social History Narrative  ? Not on file  ? ?Social Determinants of Health  ? ?Financial Resource Strain: Low Risk   ? Difficulty of Paying Living Expenses: Not hard at all  ?Food Insecurity: No Food Insecurity  ? Worried About Charity fundraiser in the Last Year: Never true  ? Ran Out of Food in the Last Year: Never true  ?Transportation Needs: No Transportation Needs  ? Lack of Transportation (Medical): No  ? Lack of Transportation (Non-Medical): No  ?Physical Activity: Sufficiently Active  ? Days of Exercise per Week: 5 days  ? Minutes of Exercise per Session: 30 min  ?Stress: No Stress Concern Present  ? Feeling of Stress : Not at all  ?Social Connections: Socially Integrated  ? Frequency of Communication with Friends and Family: More than three times a week  ? Frequency of Social Gatherings with Friends and Family: More than three times a week  ? Attends Religious Services: More than 4 times per year  ? Active Member of Clubs or Organizations: Yes  ? Attends Archivist Meetings: More than 4 times per year  ? Marital Status: Married  ? ? ?Tobacco Counseling ?Counseling given: Not Answered ? ? ?Clinical Intake: ? ?Pre-visit preparation completed: Yes ? ?Pain : No/denies pain ? ?  ? ?BMI - recorded: 29.95 ?Nutritional Status: BMI 25 -29 Overweight ?Nutritional Risks: None ?Diabetes: Yes ? ?How often do you need to have someone help you when you read instructions, pamphlets, or other written materials from your doctor or pharmacy?: 1 - Never ? ?Diabetic?Nutrition Risk Assessment: ? ?Has the patient had any N/V/D within the last 2 months?  No  ?Does the patient have any non-healing wounds?  No  ?Has the patient had any unintentional weight loss or weight gain?  No  ? ?Diabetes: ? ?Is the patient diabetic?  Yes  ?If diabetic, was a CBG obtained today?  No  ?Did the patient bring in their glucometer from home?  No  ?How often do you monitor your CBG's? BID.   ? ?Financial Strains and Diabetes Management: ? ?Are you having any financial strains with the device, your supplies or your medication? No .  ?Does the patient want to be seen by Chronic Care Management for management of their diabetes?  No  ?Would the patient like to be referred to a Nutritionist or for Diabetic Management?  No  ? ?Diabetic Exams: ? ?Diabetic Eye Exam: Completed 03/12/2022.  Pt has been advised about the importance in completing this exam. A referral has been placed today. Message sent to referral coordinator for scheduling purposes. Advised pt to expect a call from office referred to regarding appt. ? ?Diabetic Foot Exam: Completed 01/26/2021. Pt has been advised about the importance in completing this exam.  ? ?  Interpreter Needed?: No ? ?Information entered by :: [email or web address], Anthony Espinoza ? ? ?Activities of Daily Living ? ?  03/14/2022  ?  8:28 AM  ?In your present state of health, do you have any difficulty performing the following activities:  ?Hearing? 0  ?Vision? 0  ?Difficulty concentrating or making decisions? 0  ?Walking or climbing stairs? 0  ?Dressing or bathing? 0  ?Doing errands, shopping? 0  ?Preparing Food and eating ? N  ?Using the Toilet? N  ?In the past six months, have you accidently leaked urine? N  ?Do you have problems with loss of bowel control? N  ?Managing your Medications? N  ?Managing your Finances? N  ?Housekeeping or managing your Housekeeping? N  ? ? ?Patient Care Team: ?[name], [name] as PCP - General (Family Medicine) ?Harlen Labs, MD as Referring Physician (Optometry) ? ?Indicate any recent Medical Services you may have received from other than [facility] providers in the past year (date may be approximate). ? ?   ?Assessment:  ? This is a routine wellness examination for Alam. ? ?Hearing/Vision screen ?Hearing Screening - Comments:: No hearing issues.  ?Vision Screening - Comments:: Readers. 03/12/2022. [facility] [place] ? ?Dietary issues and exercise activities  discussed: ?Current Exercise Habits: Home exercise routine, Type of exercise: walking, Time (Minutes): 30, Frequency (Times/Week): 5, Weekly Exercise (Minutes/Week): 150, Intensity: Mild, Exercise limited by: cardiac conditi

## 2022-04-16 ENCOUNTER — Other Ambulatory Visit: Payer: Self-pay | Admitting: Family Medicine

## 2022-04-24 ENCOUNTER — Other Ambulatory Visit: Payer: Self-pay | Admitting: Family Medicine

## 2022-04-24 DIAGNOSIS — M5136 Other intervertebral disc degeneration, lumbar region: Secondary | ICD-10-CM

## 2022-04-27 ENCOUNTER — Ambulatory Visit (INDEPENDENT_AMBULATORY_CARE_PROVIDER_SITE_OTHER): Payer: Medicare HMO | Admitting: Family Medicine

## 2022-04-27 ENCOUNTER — Encounter: Payer: Self-pay | Admitting: Family Medicine

## 2022-04-27 VITALS — BP 148/89 | HR 116 | Temp 98.2°F | Ht 73.0 in | Wt 228.4 lb

## 2022-04-27 DIAGNOSIS — E1159 Type 2 diabetes mellitus with other circulatory complications: Secondary | ICD-10-CM

## 2022-04-27 DIAGNOSIS — E1169 Type 2 diabetes mellitus with other specified complication: Secondary | ICD-10-CM

## 2022-04-27 DIAGNOSIS — N400 Enlarged prostate without lower urinary tract symptoms: Secondary | ICD-10-CM | POA: Diagnosis not present

## 2022-04-27 DIAGNOSIS — R000 Tachycardia, unspecified: Secondary | ICD-10-CM

## 2022-04-27 DIAGNOSIS — I152 Hypertension secondary to endocrine disorders: Secondary | ICD-10-CM

## 2022-04-27 DIAGNOSIS — Z0000 Encounter for general adult medical examination without abnormal findings: Secondary | ICD-10-CM

## 2022-04-27 DIAGNOSIS — M26609 Unspecified temporomandibular joint disorder, unspecified side: Secondary | ICD-10-CM

## 2022-04-27 DIAGNOSIS — Z0001 Encounter for general adult medical examination with abnormal findings: Secondary | ICD-10-CM | POA: Diagnosis not present

## 2022-04-27 DIAGNOSIS — E785 Hyperlipidemia, unspecified: Secondary | ICD-10-CM | POA: Diagnosis not present

## 2022-04-27 LAB — BAYER DCA HB A1C WAIVED: HB A1C (BAYER DCA - WAIVED): 6.4 % — ABNORMAL HIGH (ref 4.8–5.6)

## 2022-04-27 MED ORDER — METOPROLOL SUCCINATE ER 50 MG PO TB24: 50.0000 mg | ORAL_TABLET | Freq: Every day | ORAL | 1 refills | Status: DC

## 2022-04-27 MED ORDER — DAPAGLIFLOZIN PROPANEDIOL 10 MG PO TABS: 10.0000 mg | ORAL_TABLET | Freq: Every day | ORAL | 2 refills | Status: DC

## 2022-04-27 MED ORDER — CYCLOBENZAPRINE HCL 10 MG PO TABS: 10.0000 mg | ORAL_TABLET | Freq: Three times a day (TID) | ORAL | 5 refills | Status: DC | PRN

## 2022-04-27 MED ORDER — AMLODIPINE BESYLATE 10 MG PO TABS: 10.0000 mg | ORAL_TABLET | Freq: Every day | ORAL | 1 refills | Status: DC

## 2022-04-27 MED ORDER — METFORMIN HCL ER 500 MG PO TB24: 500.0000 mg | ORAL_TABLET | Freq: Two times a day (BID) | ORAL | 1 refills | Status: DC

## 2022-04-27 MED ORDER — ATORVASTATIN CALCIUM 20 MG PO TABS: 20.0000 mg | ORAL_TABLET | Freq: Every day | ORAL | 1 refills | Status: DC

## 2022-04-27 MED ORDER — TAMSULOSIN HCL 0.4 MG PO CAPS: ORAL_CAPSULE | ORAL | 1 refills | Status: DC

## 2022-04-27 MED ORDER — LISINOPRIL 2.5 MG PO TABS: 2.5000 mg | ORAL_TABLET | Freq: Every day | ORAL | 1 refills | Status: DC

## 2022-04-27 NOTE — Progress Notes (Unsigned)
Assessment & Plan:  Well adult exam Discussed health benefits of physical activity, and encouraged him to engage in regular exercise appropriate for his age and condition. Preventive health education provided. Patient declined AAA screening ultrasound and repeat COVID booster.  Immunization History  Administered Date(s) Administered   Fluad Quad(high Dose 65+) 08/15/2016, 09/06/2017, 09/18/2018, 08/25/2020   Influenza, High Dose Seasonal PF 08/15/2016, 09/06/2017, 09/18/2018   Influenza, Quadrivalent, Recombinant, Inj, Pf 08/25/2019   Influenza, Seasonal, Injecte, Preservative Fre 09/30/2015   Influenza,trivalent, recombinat, inj, PF 08/26/2014   Influenza-Unspecified 08/26/2014, 08/15/2016, 09/06/2017, 09/18/2018, 08/31/2021   Moderna SARS-COV2 Booster Vaccination 02/13/2021   Moderna Sars-Covid-2 Vaccination 02/17/2020, 03/15/2020, 09/22/2020, 02/13/2021   Pneumococcal Conjugate-13 06/14/2016, 08/25/2019   Pneumococcal Polysaccharide-23 09/06/2017   Tdap 03/05/1999, 01/26/2021   Health Maintenance  Topic Date Due   OPHTHALMOLOGY EXAM  12/29/2020   FOOT EXAM  01/26/2022   COVID-19 Vaccine (5 - Moderna series) 05/13/2022 (Originally 04/10/2021)   Zoster Vaccines- Shingrix (1 of 2) 07/28/2022 (Originally 01/29/2001)   HEMOGLOBIN A1C  05/01/2022   INFLUENZA VACCINE  06/12/2022   Fecal DNA (Cologuard)  02/08/2024   TETANUS/TDAP  01/27/2031   Pneumonia Vaccine 20+ Years old  Completed   Hepatitis C Screening  Completed   HPV VACCINES  Aged Out   COLONOSCOPY (Pts 45-67yr Insurance coverage will need to be confirmed)  Discontinued    ***  Follow-up: No follow-ups on file.   BHendricks Limes MSN, APRN, [name]-C [facility]  Subjective:  Patient ID: Anthony Espinoza male    DOB: 324-Nov-1952 Age: 71y.o. MRN: [id number] Patient Care Team: JLoman Brooklyn [name] as PCP - General (Family Medicine) LHarlen Labs MD as Referring Physician (Optometry)   CC:   Chief Complaint  Patient presents with   Annual Exam    HPI LTIN ENGRAMis a 71y.o. male who presents today for a complete physical exam. He reports consuming a general diet. Home exercise routine includes yard work. He generally feels well. He reports sleeping well. He does not have additional problems to discuss today.   Vision:Within last year Dental:Receives regular dental care  AAA Screening: declined.  Advanced Directives Patient does have advanced directives including living will. He does have a copy in the electronic medical record.   DEPRESSION SCREENING    04/27/2022    9:35 AM 03/14/2022    8:25 AM 10/31/2021   10:40 AM 04/28/2021   11:42 AM 03/03/2021   10:03 AM 01/26/2021    9:18 AM 10/05/2020    8:12 AM  PHQ 2/9 Scores  PHQ - 2 Score 0 0 0 0 0 0 0  PHQ- 9 Score 0  0 0        Metformin causing diarrhea  Review of Systems  Constitutional:  Negative for chills, fever, malaise/fatigue and weight loss.  HENT:  Negative for congestion, ear discharge, ear pain, nosebleeds, sinus pain, sore throat and tinnitus.   Eyes:  Negative for blurred vision, double vision, pain, discharge and redness.  Respiratory:  Negative for cough, shortness of breath and wheezing.   Cardiovascular:  Negative for chest pain, palpitations and leg swelling.  Gastrointestinal:  Negative for abdominal pain, constipation, diarrhea, heartburn, nausea and vomiting.  Genitourinary:  Negative for dysuria, frequency and urgency.       Denies trouble initiating a urine stream, weak stream, split stream, nocturia, and dribbling.   Musculoskeletal:  Negative for myalgias.  Skin:  Negative for rash.  Neurological:  Negative for dizziness, seizures, weakness and headaches.  Psychiatric/Behavioral:  Negative for depression, substance abuse and suicidal ideas. The patient is not nervous/anxious.      Current Outpatient Medications:    amLODipine (NORVASC) 10 MG tablet, Take 1 tablet (10 mg total) by  mouth daily., Disp: 90 tablet, Rfl: 1   aspirin 81 MG chewable tablet, Chew 81 mg by mouth daily., Disp: , Rfl:    atorvastatin (LIPITOR) 20 MG tablet, Take 1 tablet (20 mg total) by mouth daily. for cholesterol., Disp: 90 tablet, Rfl: 1   cyclobenzaprine (FLEXERIL) 10 MG tablet, Take 1 tablet by mouth three times daily as needed for muscle spasm, Disp: 60 tablet, Rfl: 5   glipiZIDE (GLUCOTROL XL) 10 MG 24 hr tablet, Take 1 tablet by mouth once daily, Disp: 90 tablet, Rfl: 0   ibuprofen (ADVIL) 800 MG tablet, TAKE 1 TABLET BY MOUTH EVERY 8 HOURS AS NEEDED, Disp: 90 tablet, Rfl: 0   lisinopril (ZESTRIL) 2.5 MG tablet, Take 1 tablet (2.5 mg total) by mouth daily., Disp: 90 tablet, Rfl: 1   metFORMIN (GLUCOPHAGE) 500 MG tablet, TAKE 2 TABLETS BY MOUTH TWICE DAILY WITH A MEAL, Disp: 360 tablet, Rfl: 0   metoprolol succinate (TOPROL XL) 50 MG 24 hr tablet, Take 1 tablet (50 mg total) by mouth daily., Disp: 90 tablet, Rfl: 1   tamsulosin (FLOMAX) 0.4 MG CAPS capsule, 1 capsule daily, Disp: 90 capsule, Rfl: 1  Allergies  Allergen Reactions   Sulfa Antibiotics Other (See Comments)    Ulcers in mouth Ulcers in mouth Ulcers in mouth   Other     Other reaction(s): GI problems    Past Medical History:  Diagnosis Date   Diabetes mellitus without complication ([place])    Hyperlipidemia    Hypertension     Past Surgical History:  Procedure Laterality Date   BACK SURGERY     x6   NECK SURGERY     SPINAL CORD STIMULATOR IMPLANT      Family History  Problem Relation Age of Onset   Diabetes Mother    Parkinson's disease Brother     Social History   Socioeconomic History   Marital status: Married    Spouse name: Not on file   Number of children: Not on file   Years of education: Not on file   Highest education level: Not on file  Occupational History   Occupation: Retired  Tobacco Use   Smoking status: Former    Types: Cigarettes    Quit date: 10/28/1999    Years since quitting: 22.5    Smokeless tobacco: Never  Vaping Use   Vaping Use: Never used  Substance and Sexual Activity   Alcohol use: Not Currently   Drug use: Never   Sexual activity: Not on file  Other Topics Concern   Not on file  Social History Narrative   Not on file   Social Determinants of Health   Financial Resource Strain: Low Risk  (03/14/2022)   Overall Financial Resource Strain (CARDIA)    Difficulty of Paying Living Expenses: Not hard at all  Food Insecurity: No Food Insecurity (03/14/2022)   Hunger Vital Sign    Worried About Running Out of Food in the Last Year: Never true    Ran Out of Food in the Last Year: Never true  Transportation Needs: No Transportation Needs (03/14/2022)   PRAPARE - Hydrologist (Medical): No    Lack of Transportation (Non-Medical):  No  Physical Activity: Sufficiently Active (03/14/2022)   Exercise Vital Sign    Days of Exercise per Week: 5 days    Minutes of Exercise per Session: 30 min  Stress: No Stress Concern Present (03/14/2022)   [place]    Feeling of Stress : Not at all  Social Connections: Socially Integrated (03/14/2022)   Social Connection and Isolation Panel [NHANES]    Frequency of Communication with Friends and Family: More than three times a week    Frequency of Social Gatherings with Friends and Family: More than three times a week    Attends Religious Services: More than 4 times per year    Active Member of [employer] or Organizations: Yes    Attends Music therapist: More than 4 times per year    Marital Status: Married  Human resources officer Violence: Not At Risk (03/14/2022)   Humiliation, Afraid, Rape, and Kick questionnaire    Fear of Current or Ex-Partner: No    Emotionally Abused: No    Physically Abused: No    Sexually Abused: No      Objective:    BP (!) 148/89   Pulse (!) 116   Temp 98.2 F (36.8 C) (Temporal)   Ht 6' 1"  (1.854 m)    Wt 228 lb 6.4 oz (103.6 kg)   SpO2 97%   BMI 30.13 kg/m   BP Readings from Last 3 Encounters:  04/27/22 (!) 148/89  10/31/21 (!) 142/86  04/28/21 132/79   Wt Readings from Last 3 Encounters:  04/27/22 228 lb 6.4 oz (103.6 kg)  03/14/22 227 lb (103 kg)  10/31/21 227 lb 3.2 oz (103.1 kg)    Physical Exam Vitals reviewed.  Constitutional:      General: He is not in acute distress.    Appearance: Normal appearance. He is obese. He is not ill-appearing, toxic-appearing or diaphoretic.  HENT:     Head: Normocephalic and atraumatic.     Right Ear: Tympanic membrane, ear canal and external ear normal. There is no impacted cerumen.     Left Ear: Tympanic membrane, ear canal and external ear normal. There is no impacted cerumen.     Nose: Nose normal. No congestion or rhinorrhea.     Mouth/Throat:     Mouth: Mucous membranes are moist.     Pharynx: Oropharynx is clear. No oropharyngeal exudate or posterior oropharyngeal erythema.  Eyes:     General: No scleral icterus.       Right eye: No discharge.        Left eye: No discharge.     Conjunctiva/sclera: Conjunctivae normal.     Pupils: Pupils are equal, round, and reactive to light.  Neck:     Vascular: No carotid bruit.  Cardiovascular:     Rate and Rhythm: Normal rate and regular rhythm.     Heart sounds: Normal heart sounds. No murmur heard.    No friction rub. No gallop.  Pulmonary:     Effort: Pulmonary effort is normal. No respiratory distress.     Breath sounds: Normal breath sounds. No stridor. No wheezing, rhonchi or rales.  Abdominal:     General: Abdomen is flat. Bowel sounds are normal. There is no distension.     Palpations: Abdomen is soft. There is no hepatomegaly, splenomegaly or mass.     Tenderness: There is no abdominal tenderness. There is no guarding or rebound.     Hernia: No hernia is present.  Musculoskeletal:        General: Normal range of motion.     Cervical back: Normal range of motion and neck  supple. No rigidity. No muscular tenderness.     Right lower leg: No edema.     Left lower leg: No edema.  Lymphadenopathy:     Cervical: No cervical adenopathy.  Skin:    General: Skin is warm and dry.     Capillary Refill: Capillary refill takes less than 2 seconds.  Neurological:     General: No focal deficit present.     Mental Status: He is alert and oriented to person, place, and time. Mental status is at baseline.  Psychiatric:        Mood and Affect: Mood normal.        Behavior: Behavior normal.        Thought Content: Thought content normal.        Judgment: Judgment normal.    Diabetic Foot Exam - Simple   Simple Foot Form Diabetic Foot exam was performed with the following findings: Yes 04/27/2022 10:11 AM  Visual Inspection No deformities, no ulcerations, no other skin breakdown bilaterally: Yes Sensation Testing Intact to touch and monofilament testing bilaterally: Yes Pulse Check Posterior Tibialis and Dorsalis pulse intact bilaterally: Yes Comments     No results found for: "TSH" Lab Results  Component Value Date   WBC 6.8 10/31/2021   HGB 15.7 10/31/2021   HCT 45.6 10/31/2021   MCV 90 10/31/2021   PLT 302 10/31/2021   Lab Results  Component Value Date   NA 139 10/31/2021   K 4.3 10/31/2021   CO2 19 (L) 10/31/2021   GLUCOSE 158 (H) 10/31/2021   BUN 14 10/31/2021   CREATININE 0.83 10/31/2021   BILITOT 0.5 10/31/2021   ALKPHOS 64 10/31/2021   AST 21 10/31/2021   ALT 21 10/31/2021   PROT 7.3 10/31/2021   ALBUMIN 4.7 10/31/2021   CALCIUM 9.4 10/31/2021   EGFR 94 10/31/2021   Lab Results  Component Value Date   CHOL 112 10/31/2021   Lab Results  Component Value Date   HDL 40 10/31/2021   Lab Results  Component Value Date   LDLCALC 56 10/31/2021   Lab Results  Component Value Date   TRIG 78 10/31/2021   Lab Results  Component Value Date   CHOLHDL 2.8 10/31/2021   Lab Results  Component Value Date   HGBA1C 6.8 (H) 10/31/2021

## 2022-04-28 LAB — CBC WITH DIFFERENTIAL/PLATELET
Basophils Absolute: 0.1 10*3/uL (ref 0.0–0.2)
Basos: 1 %
EOS (ABSOLUTE): 0.4 10*3/uL (ref 0.0–0.4)
Eos: 5 %
Hematocrit: 46.1 % (ref 37.5–51.0)
Hemoglobin: 15.7 g/dL (ref 13.0–17.7)
Immature Grans (Abs): 0 10*3/uL (ref 0.0–0.1)
Immature Granulocytes: 0 %
Lymphocytes Absolute: 2.3 10*3/uL (ref 0.7–3.1)
Lymphs: 29 %
MCH: 30.8 pg (ref 26.6–33.0)
MCHC: 34.1 g/dL (ref 31.5–35.7)
MCV: 90 fL (ref 79–97)
Monocytes Absolute: 0.7 10*3/uL (ref 0.1–0.9)
Monocytes: 8 %
Neutrophils Absolute: 4.6 10*3/uL (ref 1.4–7.0)
Neutrophils: 57 %
Platelets: 335 10*3/uL (ref 150–450)
RBC: 5.1 x10E6/uL (ref 4.14–5.80)
RDW: 13.7 % (ref 11.6–15.4)
WBC: 8 10*3/uL (ref 3.4–10.8)

## 2022-04-28 LAB — LIPID PANEL
Chol/HDL Ratio: 2.9 ratio (ref 0.0–5.0)
Cholesterol, Total: 116 mg/dL (ref 100–199)
HDL: 40 mg/dL (ref >39)
LDL Chol Calc (NIH): 49 mg/dL (ref 0–99)
Triglycerides: 163 mg/dL — ABNORMAL HIGH (ref 0–149)
VLDL Cholesterol Cal: 27 mg/dL (ref 5–40)

## 2022-04-28 LAB — CMP14+EGFR
ALT: 23 IU/L (ref 0–44)
AST: 23 IU/L (ref 0–40)
Albumin/Globulin Ratio: 1.7 (ref 1.2–2.2)
Albumin: 4.7 g/dL (ref 3.7–4.7)
Alkaline Phosphatase: 60 IU/L (ref 44–121)
BUN/Creatinine Ratio: 30 — ABNORMAL HIGH (ref 10–24)
BUN: 21 mg/dL (ref 8–27)
Bilirubin Total: 0.3 mg/dL (ref 0.0–1.2)
CO2: 20 mmol/L (ref 20–29)
Calcium: 10.2 mg/dL (ref 8.6–10.2)
Chloride: 100 mmol/L (ref 96–106)
Creatinine, Ser: 0.7 mg/dL — ABNORMAL LOW (ref 0.76–1.27)
Globulin, Total: 2.7 g/dL (ref 1.5–4.5)
Glucose: 139 mg/dL — ABNORMAL HIGH (ref 70–99)
Potassium: 4.9 mmol/L (ref 3.5–5.2)
Sodium: 139 mmol/L (ref 134–144)
Total Protein: 7.4 g/dL (ref 6.0–8.5)
eGFR: 99 mL/min/{1.73_m2} (ref >59)

## 2022-04-28 LAB — MICROALBUMIN / CREATININE URINE RATIO
Creatinine, Urine: 76.1 mg/dL
Microalb/Creat Ratio: 9 mg/g creat (ref 0–29)
Microalbumin, Urine: 7.1 ug/mL

## 2022-04-28 LAB — VITAMIN B12: Vitamin B-12: 609 pg/mL (ref 232–1245)

## 2022-04-30 ENCOUNTER — Encounter: Payer: Self-pay | Admitting: Family Medicine

## 2022-05-01 ENCOUNTER — Encounter: Payer: Self-pay | Admitting: Family Medicine

## 2022-05-01 NOTE — Progress Notes (Signed)
Updated chart.

## 2022-06-20 DIAGNOSIS — D239 Other benign neoplasm of skin, unspecified: Secondary | ICD-10-CM | POA: Diagnosis not present

## 2022-06-20 DIAGNOSIS — Z85828 Personal history of other malignant neoplasm of skin: Secondary | ICD-10-CM | POA: Diagnosis not present

## 2022-06-20 DIAGNOSIS — L570 Actinic keratosis: Secondary | ICD-10-CM | POA: Diagnosis not present

## 2022-06-25 ENCOUNTER — Other Ambulatory Visit: Payer: Self-pay | Admitting: Family Medicine

## 2022-06-25 DIAGNOSIS — M5136 Other intervertebral disc degeneration, lumbar region: Secondary | ICD-10-CM

## 2022-07-06 ENCOUNTER — Ambulatory Visit (INDEPENDENT_AMBULATORY_CARE_PROVIDER_SITE_OTHER): Payer: Medicare HMO | Admitting: Family Medicine

## 2022-07-06 ENCOUNTER — Encounter: Payer: Self-pay | Admitting: Family Medicine

## 2022-07-06 VITALS — BP 140/89 | HR 110 | Temp 98.0°F | Ht 73.0 in | Wt 231.4 lb

## 2022-07-06 DIAGNOSIS — M545 Low back pain, unspecified: Secondary | ICD-10-CM | POA: Diagnosis not present

## 2022-07-06 MED ORDER — OXYCODONE-ACETAMINOPHEN 5-325 MG PO TABS: 1.0000 | ORAL_TABLET | Freq: Three times a day (TID) | ORAL | 0 refills | Status: AC | PRN

## 2022-07-06 MED ORDER — PREDNISONE 10 MG (21) PO TBPK: ORAL_TABLET | ORAL | 0 refills | Status: DC

## 2022-07-06 NOTE — Progress Notes (Unsigned)
Assessment & Plan:  1. Acute right-sided low back pain without sciatica Back exercises provided for patient to complete at home.  Encouraged heating pad, muscle rub, Tylenol/ibuprofen.  May take Percocet for breakthrough pain.  PDMP reviewed with no concerning findings. - predniSONE (STERAPRED UNI-PAK 21 TAB) 10 MG (21) TBPK tablet; As directed x 6 days  Dispense: 21 tablet; Refill: 0 - oxyCODONE-acetaminophen (PERCOCET/ROXICET) 5-325 MG tablet; Take 1 tablet by mouth every 8 (eight) hours as needed for up to 5 days for severe pain.  Dispense: 15 tablet; Refill: 0   Follow up plan: Return as scheduled.  Hendricks Limes, MSN, APRN, [name]-C [facility]  Subjective:   Patient ID: Anthony Espinoza, male    DOB: 1951-10-15, 71 y.o.   MRN: [id number]  HPI: Anthony Espinoza is a 71 y.o. male presenting on 07/06/2022 for Back Pain (X 1 week after weed eating )  He reports chronic back pain that worsened a week ago after weed eating. The pain is located in the right lumbar areawithout radiation. It is described as aching and throbbing, is severe in intensity, occurring constantly. Aggravating factors: Activity  Relieving factors: none. He has tried NSAIDs with no relief.    ROS: Negative unless specifically indicated above in HPI.   Relevant past medical history reviewed and updated as indicated.   Allergies and medications reviewed and updated.   Current Outpatient Medications:    amLODipine (NORVASC) 10 MG tablet, Take 1 tablet (10 mg total) by mouth daily., Disp: 90 tablet, Rfl: 1   aspirin 81 MG chewable tablet, Chew 81 mg by mouth daily., Disp: , Rfl:    atorvastatin (LIPITOR) 20 MG tablet, Take 1 tablet (20 mg total) by mouth daily. for cholesterol., Disp: 90 tablet, Rfl: 1   cyclobenzaprine (FLEXERIL) 10 MG tablet, Take 1 tablet (10 mg total) by mouth 3 (three) times daily as needed. for muscle spams, Disp: 60 tablet, Rfl: 5   dapagliflozin propanediol (FARXIGA) 10  MG TABS tablet, Take 1 tablet (10 mg total) by mouth daily., Disp: 30 tablet, Rfl: 2   glipiZIDE (GLUCOTROL XL) 10 MG 24 hr tablet, Take 1 tablet by mouth once daily, Disp: 90 tablet, Rfl: 0   ibuprofen (ADVIL) 800 MG tablet, TAKE 1 TABLET BY MOUTH EVERY 8 HOURS AS NEEDED, Disp: 90 tablet, Rfl: 0   lisinopril (ZESTRIL) 2.5 MG tablet, Take 1 tablet (2.5 mg total) by mouth daily., Disp: 90 tablet, Rfl: 1   metFORMIN (GLUCOPHAGE-XR) 500 MG 24 hr tablet, Take 1 tablet (500 mg total) by mouth in the morning and at bedtime., Disp: 180 tablet, Rfl: 1   metoprolol succinate (TOPROL XL) 50 MG 24 hr tablet, Take 1 tablet (50 mg total) by mouth daily., Disp: 90 tablet, Rfl: 1   tamsulosin (FLOMAX) 0.4 MG CAPS capsule, 1 capsule daily, Disp: 90 capsule, Rfl: 1  Allergies  Allergen Reactions   Sulfa Antibiotics Other (See Comments)    Ulcers in mouth Ulcers in mouth Ulcers in mouth   Other     Other reaction(s): GI problems    Objective:   BP (!) 140/89   Pulse (!) 110   Temp 98 F (36.7 C) (Temporal)   Ht '6\' 1"'$  (1.854 m)   Wt 231 lb 6.4 oz (105 kg)   SpO2 98%   BMI 30.53 kg/m    Physical Exam Vitals reviewed.  Constitutional:      General: He is not in acute distress.    Appearance:  Normal appearance. He is not ill-appearing, toxic-appearing or diaphoretic.  HENT:     Head: Normocephalic and atraumatic.  Eyes:     General: No scleral icterus.       Right eye: No discharge.        Left eye: No discharge.     Conjunctiva/sclera: Conjunctivae normal.  Cardiovascular:     Rate and Rhythm: Normal rate.  Pulmonary:     Effort: Pulmonary effort is normal. No respiratory distress.  Musculoskeletal:        General: Normal range of motion.     Cervical back: Normal range of motion.     Lumbar back: Tenderness (right side) present. No swelling, edema, deformity, signs of trauma, lacerations or bony tenderness. Normal range of motion.  Skin:    General: Skin is warm and dry.   Neurological:     Mental Status: He is alert and oriented to person, place, and time. Mental status is at baseline.  Psychiatric:        Mood and Affect: Mood normal.        Behavior: Behavior normal.        Thought Content: Thought content normal.        Judgment: Judgment normal.

## 2022-07-11 ENCOUNTER — Other Ambulatory Visit: Payer: Self-pay | Admitting: Family Medicine

## 2022-07-26 ENCOUNTER — Other Ambulatory Visit: Payer: Self-pay | Admitting: Family Medicine

## 2022-07-26 DIAGNOSIS — M5136 Other intervertebral disc degeneration, lumbar region: Secondary | ICD-10-CM

## 2022-08-01 ENCOUNTER — Ambulatory Visit (INDEPENDENT_AMBULATORY_CARE_PROVIDER_SITE_OTHER): Payer: Medicare HMO | Admitting: Family Medicine

## 2022-08-01 ENCOUNTER — Encounter: Payer: Self-pay | Admitting: Family Medicine

## 2022-08-01 VITALS — BP 116/74 | HR 91 | Temp 98.1°F | Resp 20 | Ht 73.0 in | Wt 228.0 lb

## 2022-08-01 DIAGNOSIS — E1159 Type 2 diabetes mellitus with other circulatory complications: Secondary | ICD-10-CM | POA: Diagnosis not present

## 2022-08-01 DIAGNOSIS — M545 Low back pain, unspecified: Secondary | ICD-10-CM

## 2022-08-01 DIAGNOSIS — I152 Hypertension secondary to endocrine disorders: Secondary | ICD-10-CM

## 2022-08-01 DIAGNOSIS — N400 Enlarged prostate without lower urinary tract symptoms: Secondary | ICD-10-CM | POA: Diagnosis not present

## 2022-08-01 DIAGNOSIS — E1169 Type 2 diabetes mellitus with other specified complication: Secondary | ICD-10-CM | POA: Diagnosis not present

## 2022-08-01 DIAGNOSIS — E785 Hyperlipidemia, unspecified: Secondary | ICD-10-CM

## 2022-08-01 DIAGNOSIS — R000 Tachycardia, unspecified: Secondary | ICD-10-CM | POA: Diagnosis not present

## 2022-08-01 LAB — BAYER DCA HB A1C WAIVED: HB A1C (BAYER DCA - WAIVED): 6.8 % — ABNORMAL HIGH (ref 4.8–5.6)

## 2022-08-01 MED ORDER — DAPAGLIFLOZIN PROPANEDIOL 10 MG PO TABS: 10.0000 mg | ORAL_TABLET | Freq: Every day | ORAL | 5 refills | Status: DC

## 2022-08-01 MED ORDER — PREDNISONE 10 MG (21) PO TBPK: ORAL_TABLET | ORAL | 0 refills | Status: DC

## 2022-08-01 MED ORDER — OXYCODONE-ACETAMINOPHEN 5-325 MG PO TABS: 1.0000 | ORAL_TABLET | Freq: Three times a day (TID) | ORAL | 0 refills | Status: AC | PRN

## 2022-08-01 NOTE — Progress Notes (Signed)
Assessment & Plan:  1. DM type 2 with diabetic dyslipidemia (HCC) A1c is 6.8 today which did increase slightly from 6.4 - Diabetes is at goal of A1c < 7. - Medications: continue current medications - Patient is currently taking a statin. Patient is taking an ACE-inhibitor/ARB.  - Diabetic eye exam requested from [facility] Maintenance Due  Topic Date Due   OPHTHALMOLOGY EXAM  12/29/2020   HEMOGLOBIN A1C  10/27/2022   FOOT EXAM  04/28/2023    Lab Results  Component Value Date   LABMICR 7.1 04/27/2022   LABMICR 3.0 10/28/2019   - dapagliflozin propanediol (FARXIGA) 10 MG TABS tablet; Take 1 tablet (10 mg total) by mouth daily.  Dispense: 30 tablet; Refill: 5 - CBC with Differential/Platelet - Bayer DCA Hb A1c Waived - CMP14+EGFR - Lipid panel  2. Hypertension associated with diabetes ([place]) Well controlled on current regimen.  - CBC with Differential/Platelet - CMP14+EGFR - Lipid panel  3. Benign prostatic hyperplasia without lower urinary tract symptoms Well controlled on current regimen.  - CMP14+EGFR  4. Tachycardia Well controlled on current regimen.  - CMP14+EGFR   Return in about 4 months (around 12/01/2022) for follow-up of chronic medication conditions with T. Lilia Pro.  Hendricks Limes, MSN, APRN, [name]-C [facility]  Subjective:    Patient ID: Anthony Espinoza, male    DOB: Sep 21, 1951, 71 y.o.   MRN: [id number]  Patient Care Team: [name], [name] as PCP - General (Family Medicine) Harlen Labs, MD as Referring Physician (Optometry)   Chief Complaint:  Chief Complaint  Patient presents with   Medical Management of Chronic Issues    3 mo     HPI: Anthony Espinoza is a 70 y.o. male presenting on 08/01/2022 for Medical Management of Chronic Issues (3 mo )  Diabetes: Patient presents for follow up of diabetes. Current symptoms include: none. Known diabetic complications: none. Medication compliance:  yes; previously decreased metformin due to diarrhea and started [place].  He reports today the diarrhea has resolved with this change.  Current diet: in general, a "healthy" diet  . Current exercise: walking. Home blood sugar records: patient does not check sugars. Is he  on ACE inhibitor or angiotensin II receptor blocker? Yes (Lisinopril). Is he on a statin? Yes (Atorvastatin).   Tachycardia: resting heart rate at home under 100. He does not feel his heart racing.  Hypertension: patient is checking his BP at home and reports good readings.  BPH: doing well with Flomax.   New complaints: None   Social history:  Relevant past medical, surgical, family and social history reviewed and updated as indicated. Interim medical history since our last visit reviewed.  Allergies and medications reviewed and updated.  DATA REVIEWED: CHART IN EPIC  ROS: Negative unless specifically indicated above in HPI.    Current Outpatient Medications:    amLODipine (NORVASC) 10 MG tablet, Take 1 tablet (10 mg total) by mouth daily., Disp: 90 tablet, Rfl: 1   aspirin 81 MG chewable tablet, Chew 81 mg by mouth daily., Disp: , Rfl:    atorvastatin (LIPITOR) 20 MG tablet, Take 1 tablet (20 mg total) by mouth daily. for cholesterol., Disp: 90 tablet, Rfl: 1   cyclobenzaprine (FLEXERIL) 10 MG tablet, Take 1 tablet (10 mg total) by mouth 3 (three) times daily as needed. for muscle spams, Disp: 60 tablet, Rfl: 5   dapagliflozin propanediol (FARXIGA) 10 MG TABS tablet, Take 1 tablet (10  mg total) by mouth daily., Disp: 30 tablet, Rfl: 2   glipiZIDE (GLUCOTROL XL) 10 MG 24 hr tablet, Take 1 tablet by mouth once daily, Disp: 90 tablet, Rfl: 0   ibuprofen (ADVIL) 800 MG tablet, TAKE 1 TABLET BY MOUTH EVERY 8 HOURS AS NEEDED, Disp: 90 tablet, Rfl: 0   lisinopril (ZESTRIL) 2.5 MG tablet, Take 1 tablet (2.5 mg total) by mouth daily., Disp: 90 tablet, Rfl: 1   metFORMIN (GLUCOPHAGE-XR) 500 MG 24 hr tablet, Take 1 tablet  (500 mg total) by mouth in the morning and at bedtime., Disp: 180 tablet, Rfl: 1   metoprolol succinate (TOPROL XL) 50 MG 24 hr tablet, Take 1 tablet (50 mg total) by mouth daily., Disp: 90 tablet, Rfl: 1   tamsulosin (FLOMAX) 0.4 MG CAPS capsule, 1 capsule daily, Disp: 90 capsule, Rfl: 1   Allergies  Allergen Reactions   Sulfa Antibiotics Other (See Comments)    Ulcers in mouth Ulcers in mouth Ulcers in mouth   Other     Other reaction(s): GI problems   Past Medical History:  Diagnosis Date   Basal cell carcinoma (BCC) of back 01/30/2022   Dr. Tarri Glenn   Diabetes mellitus without complication ([place])    Hyperlipidemia    Hypertension     Past Surgical History:  Procedure Laterality Date   BACK SURGERY     x6   NECK SURGERY     SPINAL CORD STIMULATOR IMPLANT      Social History   Socioeconomic History   Marital status: Married    Spouse name: Not on file   Number of children: Not on file   Years of education: Not on file   Highest education level: Not on file  Occupational History   Occupation: Retired  Tobacco Use   Smoking status: Former    Types: Cigarettes    Quit date: 10/28/1999    Years since quitting: 22.7   Smokeless tobacco: Never  Vaping Use   Vaping Use: Never used  Substance and Sexual Activity   Alcohol use: Not Currently   Drug use: Never   Sexual activity: Not on file  Other Topics Concern   Not on file  Social History Narrative   Not on file   Social Determinants of Health   Financial Resource Strain: Low Risk  (03/14/2022)   Overall Financial Resource Strain (CARDIA)    Difficulty of Paying Living Expenses: Not hard at all  Food Insecurity: No Food Insecurity (03/14/2022)   Hunger Vital Sign    Worried About Running Out of Food in the Last Year: Never true    [place] in the Last Year: Never true  Transportation Needs: No Transportation Needs (03/14/2022)   PRAPARE - Hydrologist (Medical): No    Lack of  Transportation (Non-Medical): No  Physical Activity: Sufficiently Active (03/14/2022)   Exercise Vital Sign    Days of Exercise per Week: 5 days    Minutes of Exercise per Session: 30 min  Stress: No Stress Concern Present (03/14/2022)   [place]    Feeling of Stress : Not at all  Social Connections: [place] (03/14/2022)   Social Connection and Isolation Panel [NHANES]    Frequency of Communication with Friends and Family: More than three times a week    Frequency of Social Gatherings with Friends and Family: More than three times a week    Attends Religious Services:  More than 4 times per year    Active Member of Clubs or Organizations: Yes    Attends Archivist Meetings: More than 4 times per year    Marital Status: Married  Human resources officer Violence: Not At Risk (03/14/2022)   Humiliation, Afraid, Rape, and Kick questionnaire    Fear of Current or Ex-Partner: No    Emotionally Abused: No    Physically Abused: No    Sexually Abused: No        Objective:    BP 116/74   Pulse 91   Temp 98.1 F (36.7 C)   Resp 20   Ht 6' 1"  (1.854 m)   Wt 228 lb (103.4 kg)   SpO2 97%   BMI 30.08 kg/m   Wt Readings from Last 3 Encounters:  08/01/22 228 lb (103.4 kg)  07/06/22 231 lb 6.4 oz (105 kg)  04/27/22 228 lb 6.4 oz (103.6 kg)    Physical Exam Vitals reviewed.  Constitutional:      General: He is not in acute distress.    Appearance: Normal appearance. He is obese. He is not ill-appearing, toxic-appearing or diaphoretic.  HENT:     Head: Normocephalic and atraumatic.  Eyes:     General: No scleral icterus.       Right eye: No discharge.        Left eye: No discharge.     Conjunctiva/sclera: Conjunctivae normal.  Cardiovascular:     Rate and Rhythm: Normal rate and regular rhythm.     Heart sounds: Normal heart sounds. No murmur heard.    No friction rub. No gallop.  Pulmonary:     Effort:  Pulmonary effort is normal. No respiratory distress.     Breath sounds: Normal breath sounds. No stridor. No wheezing, rhonchi or rales.  Musculoskeletal:        General: Normal range of motion.     Cervical back: Normal range of motion.  Skin:    General: Skin is warm and dry.  Neurological:     Mental Status: He is alert and oriented to person, place, and time. Mental status is at baseline.  Psychiatric:        Mood and Affect: Mood normal.        Behavior: Behavior normal.        Thought Content: Thought content normal.        Judgment: Judgment normal.    No results found for: "TSH" Lab Results  Component Value Date   WBC 8.0 04/27/2022   HGB 15.7 04/27/2022   HCT 46.1 04/27/2022   MCV 90 04/27/2022   PLT 335 04/27/2022   Lab Results  Component Value Date   NA 139 04/27/2022   K 4.9 04/27/2022   CO2 20 04/27/2022   GLUCOSE 139 (H) 04/27/2022   BUN 21 04/27/2022   CREATININE 0.70 (L) 04/27/2022   BILITOT 0.3 04/27/2022   ALKPHOS 60 04/27/2022   AST 23 04/27/2022   ALT 23 04/27/2022   PROT 7.4 04/27/2022   ALBUMIN 4.7 04/27/2022   CALCIUM 10.2 04/27/2022   EGFR 99 04/27/2022   Lab Results  Component Value Date   CHOL 116 04/27/2022   Lab Results  Component Value Date   HDL 40 04/27/2022   Lab Results  Component Value Date   LDLCALC 49 04/27/2022   Lab Results  Component Value Date   TRIG 163 (H) 04/27/2022   Lab Results  Component Value Date   CHOLHDL 2.9 04/27/2022  Lab Results  Component Value Date   HGBA1C 6.4 (H) 04/27/2022

## 2022-08-02 ENCOUNTER — Encounter: Payer: Self-pay | Admitting: Family Medicine

## 2022-08-02 LAB — LIPID PANEL
Chol/HDL Ratio: 3.1 ratio (ref 0.0–5.0)
Cholesterol, Total: 120 mg/dL (ref 100–199)
HDL: 39 mg/dL — ABNORMAL LOW (ref >39)
LDL Chol Calc (NIH): 58 mg/dL (ref 0–99)
Triglycerides: 126 mg/dL (ref 0–149)
VLDL Cholesterol Cal: 23 mg/dL (ref 5–40)

## 2022-08-02 LAB — CMP14+EGFR
ALT: 14 IU/L (ref 0–44)
AST: 15 IU/L (ref 0–40)
Albumin/Globulin Ratio: 1.7 (ref 1.2–2.2)
Albumin: 4.5 g/dL (ref 3.8–4.8)
Alkaline Phosphatase: 63 IU/L (ref 44–121)
BUN/Creatinine Ratio: 24 (ref 10–24)
BUN: 22 mg/dL (ref 8–27)
Bilirubin Total: 0.6 mg/dL (ref 0.0–1.2)
CO2: 20 mmol/L (ref 20–29)
Calcium: 9.3 mg/dL (ref 8.6–10.2)
Chloride: 101 mmol/L (ref 96–106)
Creatinine, Ser: 0.92 mg/dL (ref 0.76–1.27)
Globulin, Total: 2.6 g/dL (ref 1.5–4.5)
Glucose: 142 mg/dL — ABNORMAL HIGH (ref 70–99)
Potassium: 5 mmol/L (ref 3.5–5.2)
Sodium: 138 mmol/L (ref 134–144)
Total Protein: 7.1 g/dL (ref 6.0–8.5)
eGFR: 89 mL/min/{1.73_m2} (ref >59)

## 2022-08-02 LAB — CBC WITH DIFFERENTIAL/PLATELET
Basophils Absolute: 0.1 10*3/uL (ref 0.0–0.2)
Basos: 1 %
EOS (ABSOLUTE): 0.8 10*3/uL — ABNORMAL HIGH (ref 0.0–0.4)
Eos: 10 %
Hematocrit: 47.3 % (ref 37.5–51.0)
Hemoglobin: 16.2 g/dL (ref 13.0–17.7)
Immature Grans (Abs): 0 10*3/uL (ref 0.0–0.1)
Immature Granulocytes: 0 %
Lymphocytes Absolute: 1.6 10*3/uL (ref 0.7–3.1)
Lymphs: 20 %
MCH: 30.9 pg (ref 26.6–33.0)
MCHC: 34.2 g/dL (ref 31.5–35.7)
MCV: 90 fL (ref 79–97)
Monocytes Absolute: 0.8 10*3/uL (ref 0.1–0.9)
Monocytes: 10 %
Neutrophils Absolute: 4.7 10*3/uL (ref 1.4–7.0)
Neutrophils: 59 %
Platelets: 275 10*3/uL (ref 150–450)
RBC: 5.24 x10E6/uL (ref 4.14–5.80)
RDW: 12.5 % (ref 11.6–15.4)
WBC: 7.9 10*3/uL (ref 3.4–10.8)

## 2022-09-23 ENCOUNTER — Other Ambulatory Visit: Payer: Self-pay | Admitting: Family Medicine

## 2022-09-23 DIAGNOSIS — M5136 Other intervertebral disc degeneration, lumbar region: Secondary | ICD-10-CM

## 2022-10-02 ENCOUNTER — Ambulatory Visit (INDEPENDENT_AMBULATORY_CARE_PROVIDER_SITE_OTHER): Payer: Medicare HMO | Admitting: Family Medicine

## 2022-10-02 ENCOUNTER — Encounter: Payer: Self-pay | Admitting: Family Medicine

## 2022-10-02 DIAGNOSIS — J0100 Acute maxillary sinusitis, unspecified: Secondary | ICD-10-CM

## 2022-10-02 MED ORDER — DOXYCYCLINE HYCLATE 100 MG PO TABS: 100.0000 mg | ORAL_TABLET | Freq: Two times a day (BID) | ORAL | 0 refills | Status: AC

## 2022-10-02 NOTE — Progress Notes (Signed)
Virtual Visit via telephone Note Due to COVID-19 pandemic this visit was conducted virtually. This visit type was conducted due to national recommendations for restrictions regarding the COVID-19 Pandemic (e.g. social distancing, sheltering in place) in an effort to limit this patient's exposure and mitigate transmission in our community. All issues noted in this document were discussed and addressed.  A physical exam was not performed with this format.   I connected with Anthony Espinoza on 10/02/2022 at 1430 by telephone and verified that I am speaking with the correct person using two identifiers. Anthony Espinoza is currently located at home and family is currently with them during visit. The provider, Monia Pouch, [name] is located in their office at time of visit.  I discussed the limitations, risks, security and privacy concerns of performing an evaluation and management service by virtual visit and the availability of in person appointments. I also discussed with the patient that there may be a patient responsible charge related to this service. The patient expressed understanding and agreed to proceed.  Subjective:  Patient ID: Anthony Espinoza, male    DOB: [birth year], 71 y.o.   MRN: [id number]  Chief Complaint:  Sinusitis   HPI: [name] is a 71 y.o. male presenting on 10/02/2022 for Sinusitis   Sinusitis This is a new problem. Episode onset: 2 weeks ago. The problem has been gradually worsening since onset. The pain is moderate. Associated symptoms include chills, congestion, coughing, ear pain, headaches and sinus pressure. Pertinent negatives include no diaphoresis, hoarse voice, neck pain, shortness of breath, sneezing, sore throat or swollen glands. Past treatments include oral decongestants and nasal decongestants. The treatment provided no relief.     Relevant past medical, surgical, family, and social history reviewed and updated as indicated.  Allergies and  medications reviewed and updated.   Past Medical History:  Diagnosis Date   Basal cell carcinoma (BCC) of back 01/30/2022   Dr. Tarri Glenn   Diabetes mellitus without complication ([place])    Hyperlipidemia    Hypertension     Past Surgical History:  Procedure Laterality Date   BACK SURGERY     x6   NECK SURGERY     SPINAL CORD STIMULATOR IMPLANT      Social History   Socioeconomic History   Marital status: Married    Spouse name: Not on file   Number of children: Not on file   Years of education: Not on file   Highest education level: Not on file  Occupational History   Occupation: Retired  Tobacco Use   Smoking status: Former    Types: Cigarettes    Quit date: 10/28/1999    Years since quitting: 22.9   Smokeless tobacco: Never  Vaping Use   Vaping Use: Never used  Substance and Sexual Activity   Alcohol use: Not Currently   Drug use: Never   Sexual activity: Not on file  Other Topics Concern   Not on file  Social History Narrative   Not on file   Social Determinants of Health   Financial Resource Strain: Low Risk  (03/14/2022)   Overall Financial Resource Strain (CARDIA)    Difficulty of Paying Living Expenses: Not hard at all  Food Insecurity: No Food Insecurity (03/14/2022)   Hunger Vital Sign    Worried About Running Out of Food in the Last Year: Never true    [place] in the Last Year: Never true  Transportation Needs: No Transportation Needs (03/14/2022)  PRAPARE - Hydrologist (Medical): No    Lack of Transportation (Non-Medical): No  Physical Activity: Sufficiently Active (03/14/2022)   Exercise Vital Sign    Days of Exercise per Week: 5 days    Minutes of Exercise per Session: 30 min  Stress: No Stress Concern Present (03/14/2022)   [place]    Feeling of Stress : Not at all  Social Connections: [place] (03/14/2022)   Social Connection and  Isolation Panel [NHANES]    Frequency of Communication with Friends and Family: More than three times a week    Frequency of Social Gatherings with Friends and Family: More than three times a week    Attends Religious Services: More than 4 times per year    Active Member of [employer] or Organizations: Yes    Attends Music therapist: More than 4 times per year    Marital Status: Married  Human resources officer Violence: Not At Risk (03/14/2022)   Humiliation, Afraid, Rape, and Kick questionnaire    Fear of Current or Ex-Partner: No    Emotionally Abused: No    Physically Abused: No    Sexually Abused: No    Outpatient Encounter Medications as of 10/02/2022  Medication Sig   doxycycline (VIBRA-TABS) 100 MG tablet Take 1 tablet (100 mg total) by mouth 2 (two) times daily for 10 days. 1 po bid   amLODipine (NORVASC) 10 MG tablet Take 1 tablet (10 mg total) by mouth daily.   aspirin 81 MG chewable tablet Chew 81 mg by mouth daily.   atorvastatin (LIPITOR) 20 MG tablet Take 1 tablet (20 mg total) by mouth daily. for cholesterol.   cyclobenzaprine (FLEXERIL) 10 MG tablet Take 1 tablet (10 mg total) by mouth 3 (three) times daily as needed. for muscle spams   dapagliflozin propanediol (FARXIGA) 10 MG TABS tablet Take 1 tablet (10 mg total) by mouth daily.   glipiZIDE (GLUCOTROL XL) 10 MG 24 hr tablet Take 1 tablet by mouth once daily   ibuprofen (ADVIL) 800 MG tablet TAKE 1 TABLET BY MOUTH EVERY 8 HOURS AS NEEDED   lisinopril (ZESTRIL) 2.5 MG tablet Take 1 tablet (2.5 mg total) by mouth daily.   metFORMIN (GLUCOPHAGE-XR) 500 MG 24 hr tablet Take 1 tablet (500 mg total) by mouth in the morning and at bedtime.   metoprolol succinate (TOPROL XL) 50 MG 24 hr tablet Take 1 tablet (50 mg total) by mouth daily.   predniSONE (STERAPRED UNI-PAK 21 TAB) 10 MG (21) TBPK tablet As directed x 6 days   tamsulosin (FLOMAX) 0.4 MG CAPS capsule 1 capsule daily   No facility-administered encounter medications  on file as of 10/02/2022.    Allergies  Allergen Reactions   Sulfa Antibiotics Other (See Comments)    Ulcers in mouth Ulcers in mouth Ulcers in mouth   Other     Other reaction(s): GI problems    Review of Systems  Constitutional:  Positive for activity change, chills and fatigue. Negative for appetite change, diaphoresis, fever and unexpected weight change.  HENT:  Positive for congestion, ear pain, postnasal drip, rhinorrhea, sinus pressure and sinus pain. Negative for dental problem, drooling, ear discharge, facial swelling, hearing loss, hoarse voice, mouth sores, nosebleeds, sneezing, sore throat, tinnitus, trouble swallowing and voice change.   Respiratory:  Positive for cough. Negative for chest tightness and shortness of breath.   Cardiovascular:  Negative for chest pain, palpitations and leg  swelling.  Gastrointestinal:  Negative for abdominal pain.  Genitourinary:  Negative for decreased urine volume and difficulty urinating.  Musculoskeletal:  Negative for arthralgias, myalgias and neck pain.  Neurological:  Positive for headaches. Negative for dizziness, tremors, seizures, syncope, facial asymmetry, speech difficulty, weakness, light-headedness and numbness.  Psychiatric/Behavioral:  Negative for confusion.   All other systems reviewed and are negative.        Observations/Objective: No vital signs or physical exam, this was a virtual health encounter.  Pt alert and oriented, answers all questions appropriately, and able to speak in full sentences.    Assessment and Plan: Boyce was seen today for sinusitis.  Diagnoses and all orders for this visit:  Acute non-recurrent maxillary sinusitis Ongoing and worsening sinusitis symptoms despite symptomatic care at home. Will initiate antibiotic therapy. Aware to continue symptomatic care along with flonase daily. Report new, worsening, or persistent symptoms.  -     doxycycline (VIBRA-TABS) 100 MG tablet; Take 1 tablet  (100 mg total) by mouth 2 (two) times daily for 10 days. 1 po bid     Follow Up Instructions: Return if symptoms worsen or fail to improve.    I discussed the assessment and treatment plan with the patient. The patient was provided an opportunity to ask questions and all were answered. The patient agreed with the plan and demonstrated an understanding of the instructions.   The patient was advised to call back or seek an in-person evaluation if the symptoms worsen or if the condition fails to improve as anticipated.  The above assessment and management plan was discussed with the patient. The patient verbalized understanding of and has agreed to the management plan. Patient is aware to call the clinic if they develop any new symptoms or if symptoms persist or worsen. Patient is aware when to return to the clinic for a follow-up visit. Patient educated on when it is appropriate to go to the emergency department.    I provided 12 minutes of time during this telephone encounter.   Monia Pouch, [name]-C [facility] [address] [place], [place] [address] [phone] 10/02/2022

## 2022-10-22 ENCOUNTER — Other Ambulatory Visit: Payer: Self-pay | Admitting: Family Medicine

## 2022-10-22 DIAGNOSIS — E1169 Type 2 diabetes mellitus with other specified complication: Secondary | ICD-10-CM

## 2022-11-13 ENCOUNTER — Other Ambulatory Visit: Payer: Self-pay | Admitting: Family Medicine

## 2022-11-13 DIAGNOSIS — R000 Tachycardia, unspecified: Secondary | ICD-10-CM

## 2022-11-22 ENCOUNTER — Other Ambulatory Visit: Payer: Self-pay | Admitting: Family Medicine

## 2022-11-22 DIAGNOSIS — I152 Hypertension secondary to endocrine disorders: Secondary | ICD-10-CM

## 2022-11-30 ENCOUNTER — Ambulatory Visit (INDEPENDENT_AMBULATORY_CARE_PROVIDER_SITE_OTHER): Payer: Medicare HMO | Admitting: Family Medicine

## 2022-11-30 ENCOUNTER — Encounter: Payer: Self-pay | Admitting: Family Medicine

## 2022-11-30 VITALS — BP 125/75 | HR 108 | Temp 98.5°F | Ht 73.0 in | Wt 229.0 lb

## 2022-11-30 DIAGNOSIS — E1169 Type 2 diabetes mellitus with other specified complication: Secondary | ICD-10-CM

## 2022-11-30 DIAGNOSIS — E1159 Type 2 diabetes mellitus with other circulatory complications: Secondary | ICD-10-CM

## 2022-11-30 DIAGNOSIS — E1165 Type 2 diabetes mellitus with hyperglycemia: Secondary | ICD-10-CM

## 2022-11-30 DIAGNOSIS — R000 Tachycardia, unspecified: Secondary | ICD-10-CM

## 2022-11-30 DIAGNOSIS — E785 Hyperlipidemia, unspecified: Secondary | ICD-10-CM | POA: Diagnosis not present

## 2022-11-30 DIAGNOSIS — N400 Enlarged prostate without lower urinary tract symptoms: Secondary | ICD-10-CM | POA: Diagnosis not present

## 2022-11-30 DIAGNOSIS — M5136 Other intervertebral disc degeneration, lumbar region: Secondary | ICD-10-CM | POA: Diagnosis not present

## 2022-11-30 DIAGNOSIS — I152 Hypertension secondary to endocrine disorders: Secondary | ICD-10-CM

## 2022-11-30 LAB — BAYER DCA HB A1C WAIVED: HB A1C (BAYER DCA - WAIVED): 7.8 % — ABNORMAL HIGH (ref 4.8–5.6)

## 2022-11-30 MED ORDER — CYCLOBENZAPRINE HCL 10 MG PO TABS: 10.0000 mg | ORAL_TABLET | Freq: Three times a day (TID) | ORAL | 5 refills | Status: DC | PRN

## 2022-11-30 MED ORDER — LISINOPRIL 2.5 MG PO TABS: 2.5000 mg | ORAL_TABLET | Freq: Every day | ORAL | 3 refills | Status: DC

## 2022-11-30 MED ORDER — TAMSULOSIN HCL 0.4 MG PO CAPS: ORAL_CAPSULE | ORAL | 3 refills | Status: DC

## 2022-11-30 MED ORDER — GLIPIZIDE ER 10 MG PO TB24: 10.0000 mg | ORAL_TABLET | Freq: Every day | ORAL | 3 refills | Status: DC

## 2022-11-30 MED ORDER — AMLODIPINE BESYLATE 10 MG PO TABS: 10.0000 mg | ORAL_TABLET | Freq: Every day | ORAL | 3 refills | Status: DC

## 2022-11-30 MED ORDER — DAPAGLIFLOZIN PROPANEDIOL 10 MG PO TABS: 10.0000 mg | ORAL_TABLET | Freq: Every day | ORAL | 3 refills | Status: DC

## 2022-11-30 MED ORDER — ATORVASTATIN CALCIUM 20 MG PO TABS: 20.0000 mg | ORAL_TABLET | Freq: Every day | ORAL | 3 refills | Status: DC

## 2022-11-30 MED ORDER — METOPROLOL SUCCINATE ER 50 MG PO TB24: 50.0000 mg | ORAL_TABLET | Freq: Every day | ORAL | 3 refills | Status: DC

## 2022-11-30 MED ORDER — METFORMIN HCL ER 500 MG PO TB24: ORAL_TABLET | ORAL | 3 refills | Status: DC

## 2022-11-30 NOTE — Progress Notes (Signed)
Established Patient Office Visit  Subjective   Patient ID: Anthony Espinoza, male    DOB: 1951/03/25  Age: 72 y.o. MRN: [id number]  Chief Complaint  Patient presents with   Medical Management of Chronic Issues    HPI DM Pt presents for follow up evaluation of Type 2 diabetes mellitus.  Current symptoms include none.   Current diabetic medications include farxiga 10 mg, glipizide 10 mg, metformin 500 mg BID Compliant with meds - Yes  Current monitoring regimen: none  Eye exam current (within one year): no  Current diet:  low carb  but hasn't done as well over the last few months due to the holidays Current exercise: gardening and yard work  Urine microalbumin UTD? Yes Is He on ACE inhibitor or angiotensin II receptor blocker?  Yes, lisinopril Is He on statin? Yes atorvastatin Is He on ASA 81 mg daily?  Yes  2. HTN Complaint with meds - Yes Current Medications - amlodipine 10 mg, lisinopril 2.5, metoprolol 50 mg Checking BP at home - no Pertinent ROS:  Headache - No Fatigue - No Visual Disturbances - No Chest pain - No Dyspnea - No Palpitations - No LE edema - No  3. Chronic back pain He has lumbar DDD. He has had 3 prior surgeries. He takes flexeril and tylenol prn. Reports that pain is typically manageable. He has flares at times. During the flares, he has been prescribed oxycodone in the past for short periods with good relief. He denies numbness, tingling, saddle anesthesia, changes in bowel or bladder control.    Past Medical History:  Diagnosis Date   Basal cell carcinoma (BCC) of back 01/30/2022   Dr. Tarri Glenn   Diabetes mellitus without complication ([place])    Hyperlipidemia    Hypertension       ROS As per HPI.    Objective:     BP 125/75   Pulse (!) 108   Temp 98.5 F (36.9 C) (Temporal)   Ht '6\' 1"'$  (1.854 m)   Wt 229 lb (103.9 kg)   SpO2 96%   BMI 30.21 kg/m     Physical Exam Vitals and nursing note reviewed.  Constitutional:       General: He is not in acute distress.    Appearance: He is not ill-appearing, toxic-appearing or diaphoretic.  Neck:     Thyroid: No thyroid mass, thyromegaly or thyroid tenderness.     Vascular: No carotid bruit.  Cardiovascular:     Rate and Rhythm: Regular rhythm. Tachycardia present.     Heart sounds: Normal heart sounds. No murmur heard. Pulmonary:     Effort: Pulmonary effort is normal.     Breath sounds: Normal breath sounds.  Musculoskeletal:     Cervical back: No rigidity.     Right lower leg: No edema.     Left lower leg: No edema.  Lymphadenopathy:     Cervical: No cervical adenopathy.  Skin:    General: Skin is warm and dry.  Neurological:     General: No focal deficit present.     Mental Status: He is alert and oriented to person, place, and time.  Psychiatric:        Mood and Affect: Mood normal.        Behavior: Behavior normal.      No results found for any visits on 11/30/22.    The ASCVD Risk score (Arnett DK, et al., 2019) failed to calculate for the following reasons:   The valid  total cholesterol range is 130 to 320 mg/dL    Assessment & Plan:   Anthony Espinoza was seen today for medical management of chronic issues.  Diagnoses and all orders for this visit:  Type 2 diabetes mellitus with hyperglycemia, without long-term current use of insulin ([place]) Uncontrolled today with A1c of 7.8. He has been previously well controlled. He declined changes in medication regimen today and instead agrees to work of his diet for better control. On ACE and statin. Foot exam and urine micro are UTD. Reminded to schedule eye exam.  -     Bayer DCA Hb A1c Waived -     dapagliflozin propanediol (FARXIGA) 10 MG TABS tablet; Take 1 tablet (10 mg total) by mouth daily. -     glipiZIDE (GLUCOTROL XL) 10 MG 24 hr tablet; Take 1 tablet (10 mg total) by mouth daily. -     metFORMIN (GLUCOPHAGE-XR) 500 MG 24 hr tablet; TAKE 1 TABLET BY MOUTH IN THE MORNING AND 1 TABLET AT  BEDTIME  Hyperlipidemia associated with type 2 diabetes mellitus (HCC) Last LDL was 58. Continue atorvastatin.  -     atorvastatin (LIPITOR) 20 MG tablet; Take 1 tablet (20 mg total) by mouth daily. for cholesterol.  Hypertension associated with diabetes ([place]) Well controlled on current regimen. Continue lisinopril, amlodipine.  -     lisinopril (ZESTRIL) 2.5 MG tablet; Take 1 tablet (2.5 mg total) by mouth daily.  Tachycardia He declined EKG today. He denies any symptoms and assures me that he will notify the office for any symptoms. Continue metoprolol.   Benign prostatic hyperplasia without lower urinary tract symptoms Well controlled on current regimen.  -     tamsulosin (FLOMAX) 0.4 MG CAPS capsule; 1 capsule daily  Lumbar degenerative disc disease Well controlled on current regimen.  -     cyclobenzaprine (FLEXERIL) 10 MG tablet; Take 1 tablet (10 mg total) by mouth 3 (three) times daily as needed. for muscle spams  Return in about 3 months (around 03/01/2023) for chronic follow up.   The patient indicates understanding of these issues and agrees with the plan.  Gwenlyn Perking, [name]

## 2023-01-01 DIAGNOSIS — Z1283 Encounter for screening for malignant neoplasm of skin: Secondary | ICD-10-CM | POA: Diagnosis not present

## 2023-01-01 DIAGNOSIS — L570 Actinic keratosis: Secondary | ICD-10-CM | POA: Diagnosis not present

## 2023-01-01 DIAGNOSIS — Z85828 Personal history of other malignant neoplasm of skin: Secondary | ICD-10-CM | POA: Diagnosis not present

## 2023-01-01 DIAGNOSIS — D239 Other benign neoplasm of skin, unspecified: Secondary | ICD-10-CM | POA: Diagnosis not present

## 2023-01-31 NOTE — Progress Notes (Signed)
[facility] Quality Team Note  Name: Anthony Espinoza Date of Birth: [birth year] MRN: [id number] Date: 01/31/2023  [facility] Quality Team has reviewed this patient's chart, please see recommendations below:  [facility] Quality Other; (Called Patient to offer [facility] eye screening event 02/14/2023, left voicemail).

## 2023-02-14 LAB — HM DIABETES EYE EXAM

## 2023-03-01 ENCOUNTER — Encounter: Payer: Self-pay | Admitting: Family Medicine

## 2023-03-01 ENCOUNTER — Ambulatory Visit (INDEPENDENT_AMBULATORY_CARE_PROVIDER_SITE_OTHER): Payer: Medicare HMO | Admitting: Family Medicine

## 2023-03-01 VITALS — BP 134/81 | HR 111 | Temp 97.8°F | Ht 73.0 in | Wt 220.5 lb

## 2023-03-01 DIAGNOSIS — E785 Hyperlipidemia, unspecified: Secondary | ICD-10-CM | POA: Diagnosis not present

## 2023-03-01 DIAGNOSIS — R000 Tachycardia, unspecified: Secondary | ICD-10-CM | POA: Diagnosis not present

## 2023-03-01 DIAGNOSIS — E1159 Type 2 diabetes mellitus with other circulatory complications: Secondary | ICD-10-CM

## 2023-03-01 DIAGNOSIS — M5136 Other intervertebral disc degeneration, lumbar region: Secondary | ICD-10-CM | POA: Diagnosis not present

## 2023-03-01 DIAGNOSIS — E1169 Type 2 diabetes mellitus with other specified complication: Secondary | ICD-10-CM | POA: Diagnosis not present

## 2023-03-01 DIAGNOSIS — E1165 Type 2 diabetes mellitus with hyperglycemia: Secondary | ICD-10-CM | POA: Diagnosis not present

## 2023-03-01 DIAGNOSIS — M51369 Other intervertebral disc degeneration, lumbar region without mention of lumbar back pain or lower extremity pain: Secondary | ICD-10-CM

## 2023-03-01 DIAGNOSIS — I152 Hypertension secondary to endocrine disorders: Secondary | ICD-10-CM | POA: Diagnosis not present

## 2023-03-01 LAB — BAYER DCA HB A1C WAIVED: HB A1C (BAYER DCA - WAIVED): 7 % — ABNORMAL HIGH (ref 4.8–5.6)

## 2023-03-01 MED ORDER — METOPROLOL SUCCINATE ER 25 MG PO TB24: 25.0000 mg | ORAL_TABLET | Freq: Every day | ORAL | 3 refills | Status: DC

## 2023-03-01 MED ORDER — OXYCODONE-ACETAMINOPHEN 5-325 MG PO TABS: 1.0000 | ORAL_TABLET | ORAL | 0 refills | Status: DC | PRN

## 2023-03-01 NOTE — Progress Notes (Signed)
Established Patient Office Visit  Subjective   Patient ID: Anthony Espinoza, male    DOB: 06/29/1951  Age: 72 y.o. MRN: [id number]  Chief Complaint  Patient presents with   Medical Management of Chronic Issues   Diabetes   Hypertension   Hyperlipidemia    HPI DM Pt presents for follow up evaluation of Type 2 diabetes mellitus.  Current symptoms include none.   Current diabetic medications include farxiga 10 mg, glipizide 10 mg, metformin 500 mg BID Compliant with meds - Yes  Current monitoring regimen: none  Eye exam current (within one year): no  Current diet:  low carb   Current exercise: gardening and yard work  Urine microalbumin UTD? Yes Is He on ACE inhibitor or angiotensin II receptor blocker?  Yes, lisinopril Is He on statin? Yes atorvastatin Is He on ASA 81 mg daily?  Yes  2. HTN Complaint with meds - Yes Current Medications - amlodipine 10 mg, lisinopril 2.5, metoprolol 50 mg Checking BP at home - no Pertinent ROS:  Headache - No Fatigue - No Visual Disturbances - No Chest pain - No Dyspnea - No Palpitations - No LE edema - No  3. Chronic back pain He has lumbar DDD. He has had 3 prior surgeries. He takes flexeril and tylenol prn. Reports that pain is typically manageable. He reports an increase in lower back pain recently due to yardwork for the spring. He has been prescribed oxycodone in the past for short periods with good relief. He denies numbness, tingling, saddle anesthesia, changes in bowel or bladder control.    Past Medical History:  Diagnosis Date   Basal cell carcinoma (BCC) of back 01/30/2022   Dr. Orvan Falconer   Diabetes mellitus without complication    Hyperlipidemia    Hypertension       ROS As per HPI.    Objective:     BP 134/81   Pulse (!) 111   Temp 97.8 F (36.6 C) (Temporal)   Ht  (1.854 m)   Wt 220 lb 8 oz (100 kg)   SpO2 96%   BMI 29.09 kg/m     Physical Exam Vitals and nursing note reviewed.   Constitutional:      General: He is not in acute distress.    Appearance: He is not ill-appearing, toxic-appearing or diaphoretic.  Neck:     Thyroid: No thyroid mass, thyromegaly or thyroid tenderness.     Vascular: No carotid bruit.  Cardiovascular:     Rate and Rhythm: Regular rhythm. Tachycardia present.     Heart sounds: Normal heart sounds. No murmur heard. Pulmonary:     Effort: Pulmonary effort is normal.     Breath sounds: Normal breath sounds.  Musculoskeletal:     Cervical back: No rigidity.     Right lower leg: No edema.     Left lower leg: No edema.  Lymphadenopathy:     Cervical: No cervical adenopathy.  Skin:    General: Skin is warm and dry.  Neurological:     General: No focal deficit present.     Mental Status: He is alert and oriented to person, place, and time.  Psychiatric:        Mood and Affect: Mood normal.        Behavior: Behavior normal.      No results found for any visits on 03/01/23.    The ASCVD Risk score (Arnett DK, et al., 2019) failed to calculate for the following reasons:  The valid total cholesterol range is 130 to 320 mg/dL    Assessment & Plan:   Anthony Espinoza was seen today for medical management of chronic issues, diabetes, hypertension and hyperlipidemia.  Diagnoses and all orders for this visit:  Type 2 diabetes mellitus with hyperglycemia, without long-term current use of insulin A1c 7.0 today, not quite at goal of <7 but improved from 7.8. Medication changes today: none. He will continue current regimen and continue to improve diet. He is on an ACE/ARB and statin. Eye exam: UTD, records requested. Foot exam: UTD. Urine micro: UTD. Diet and exercise.  -     Bayer DCA Hb A1c Waived  Hyperlipidemia associated with type 2 diabetes mellitus Diet and exercise. Continue statin. Last LDL 63.  Hypertension associated with diabetes Well controlled on current regimen.   Lumbar degenerative disc disease Recent flare with increased  yard work. No red flags. Small fill of oxycodone for prn use. PDMP reviewed, no red flags. He will use sparingly and is aware that no refills will be provided.  -     oxyCODONE-acetaminophen (PERCOCET/ROXICET) 5-325 MG tablet; Take 1 tablet by mouth every 4 (four) hours as needed for up to 5 days for severe pain.  Tachycardia HR remains elevated, though asymptomatic. Has declined EKG. Regular rate and rhythm on exam. Will increase dose to 75 mg daily. He will monitor BP and HR at home and notify for abnormal readings or side effects.  -     metoprolol succinate (TOPROL-XL) 25 MG 24 hr tablet; Take 1 tablet (25 mg total) by mouth daily. Take with 50 mg ER tablet.  Return in about 3 months (around 05/31/2023) for chronic follow up.   The patient indicates understanding of these issues and agrees with the plan.  Gabriel Earing, [name]

## 2023-03-19 ENCOUNTER — Other Ambulatory Visit: Payer: Self-pay | Admitting: Family Medicine

## 2023-03-19 DIAGNOSIS — M5136 Other intervertebral disc degeneration, lumbar region: Secondary | ICD-10-CM

## 2023-03-20 ENCOUNTER — Ambulatory Visit (INDEPENDENT_AMBULATORY_CARE_PROVIDER_SITE_OTHER): Payer: Medicare HMO

## 2023-03-20 VITALS — Ht 73.0 in | Wt 227.0 lb

## 2023-03-20 DIAGNOSIS — Z0000 Encounter for general adult medical examination without abnormal findings: Secondary | ICD-10-CM

## 2023-03-20 NOTE — Progress Notes (Signed)
Subjective:   Anthony Espinoza is a 72 y.o. male who presents for Medicare Annual/Subsequent preventive examination. I connected with  KANALOA LACHANCE on 03/20/23 by a audio enabled telemedicine application and verified that I am speaking with the correct person using two identifiers.  Patient Location: Home  Provider Location: Home Office  I discussed the limitations of evaluation and management by telemedicine. The patient expressed understanding and agreed to proceed.  Review of Systems     Cardiac Risk Factors include: advanced age (>24men, >32 women);diabetes mellitus;dyslipidemia;male gender;hypertension     Objective:    Today's Vitals   03/20/23 0820  Weight: 227 lb (103 kg)  Height: 6\' 1"  (1.854 m)   Body mass index is 29.95 kg/m.     03/20/2023    8:23 AM 03/14/2022    8:27 AM 03/03/2021   10:03 AM 03/02/2020   10:29 AM  Advanced Directives  Does Patient Have a Medical Advance Directive? Yes Yes Yes Yes  Type of Estate agent of [place];Living will Healthcare Power of [place];Living will Living will Living will  Does patient want to make changes to medical advance directive? No - Patient declined  No - Patient declined No - Patient declined  Copy of Healthcare Power of Attorney in Chart? Yes - validated most recent copy scanned in chart (See row information) Yes - validated most recent copy scanned in chart (See row information)    Would patient like information on creating a medical advance directive?    No - Patient declined    Current Medications (verified) Outpatient Encounter Medications as of 03/20/2023  Medication Sig   amLODipine (NORVASC) 10 MG tablet Take 1 tablet (10 mg total) by mouth daily.   aspirin 81 MG chewable tablet Chew 81 mg by mouth daily.   atorvastatin (LIPITOR) 20 MG tablet Take 1 tablet (20 mg total) by mouth daily. for cholesterol.   cyclobenzaprine (FLEXERIL) 10 MG tablet Take 1 tablet (10 mg total) by mouth 3 (three)  times daily as needed. for muscle spams   dapagliflozin propanediol (FARXIGA) 10 MG TABS tablet Take 1 tablet (10 mg total) by mouth daily.   glipiZIDE (GLUCOTROL XL) 10 MG 24 hr tablet Take 1 tablet (10 mg total) by mouth daily.   ibuprofen (ADVIL) 800 MG tablet TAKE 1 TABLET BY MOUTH EVERY 8 HOURS AS NEEDED   lisinopril (ZESTRIL) 2.5 MG tablet Take 1 tablet (2.5 mg total) by mouth daily.   metFORMIN (GLUCOPHAGE-XR) 500 MG 24 hr tablet TAKE 1 TABLET BY MOUTH IN THE MORNING AND 1 TABLET AT BEDTIME   metoprolol succinate (TOPROL-XL) 25 MG 24 hr tablet Take 1 tablet (25 mg total) by mouth daily. Take with 50 mg ER tablet.   metoprolol succinate (TOPROL-XL) 50 MG 24 hr tablet Take 1 tablet (50 mg total) by mouth daily.   tamsulosin (FLOMAX) 0.4 MG CAPS capsule 1 capsule daily   No facility-administered encounter medications on file as of 03/20/2023.    Allergies (verified) Sulfa antibiotics and Other   History: Past Medical History:  Diagnosis Date   Basal cell carcinoma (BCC) of back 01/30/2022   Dr. Orvan Falconer   Diabetes mellitus without complication (HCC)    Hyperlipidemia    Hypertension    Past Surgical History:  Procedure Laterality Date   BACK SURGERY     x6   NECK SURGERY     SPINAL CORD STIMULATOR IMPLANT     Family History  Problem Relation Age of Onset  Diabetes Mother    Parkinson's disease Brother    Social History   Socioeconomic History   Marital status: Married    Spouse name: Not on file   Number of children: Not on file   Years of education: Not on file   Highest education level: Not on file  Occupational History   Occupation: Retired  Tobacco Use   Smoking status: Former    Types: Cigarettes    Quit date: 10/28/1999    Years since quitting: 23.4   Smokeless tobacco: Never  Vaping Use   Vaping Use: Never used  Substance and Sexual Activity   Alcohol use: Not Currently   Drug use: Never   Sexual activity: Not on file  Other Topics Concern   Not on  file  Social History Narrative   Not on file   Social Determinants of Health   Financial Resource Strain: Low Risk  (03/20/2023)   Overall Financial Resource Strain (CARDIA)    Difficulty of Paying Living Expenses: Not hard at all  Food Insecurity: No Food Insecurity (03/20/2023)   Hunger Vital Sign    Worried About Running Out of Food in the Last Year: Never true    Ran Out of Food in the Last Year: Never true  Transportation Needs: No Transportation Needs (03/20/2023)   PRAPARE - Administrator, Civil Service (Medical): No    Lack of Transportation (Non-Medical): No  Physical Activity: Insufficiently Active (03/20/2023)   Exercise Vital Sign    Days of Exercise per Week: 3 days    Minutes of Exercise per Session: 30 min  Stress: No Stress Concern Present (03/20/2023)   [employer] of Occupational Health - Occupational Stress Questionnaire    Feeling of Stress : Not at all  Social Connections: Moderately Isolated (03/20/2023)   Social Connection and Isolation Panel [NHANES]    Frequency of Communication with Friends and Family: More than three times a week    Frequency of Social Gatherings with Friends and Family: More than three times a week    Attends Religious Services: More than 4 times per year    Active Member of [employer] or Organizations: No    Attends Engineer, structural: Never    Marital Status: Divorced    Tobacco Counseling Counseling given: Not Answered   Clinical Intake:  Pre-visit preparation completed: Yes  Pain : No/denies pain     Nutritional Risks: None Diabetes: Yes CBG done?: No Did pt. bring in CBG monitor from home?: No  How often do you need to have someone help you when you read instructions, pamphlets, or other written materials from your doctor or pharmacy?: 1 - Never  Diabetic?yes Nutrition Risk Assessment:  Has the patient had any N/V/D within the last 2 months?  No  Does the patient have any non-healing wounds?  No   Has the patient had any unintentional weight loss or weight gain?  No   Diabetes:  Is the patient diabetic?  Yes  If diabetic, was a CBG obtained today?  No  Did the patient bring in their glucometer from home?  No  How often do you monitor your CBG's? Never .   Financial Strains and Diabetes Management:  Are you having any financial strains with the device, your supplies or your medication? No .  Does the patient want to be seen by Chronic Care Management for management of their diabetes?  No  Would the patient like to be referred to a Nutritionist  or for Diabetic Management?  No   Diabetic Exams:  Diabetic Eye Exam: Completed 02/2023   Diabetic Foot Exam: Overdue, Pt has been advised about the importance in completing this exam. Pt is scheduled for diabetic foot exam on next office visit .   Interpreter Needed?: No  Information entered by :: Renie Ora, LPN   Activities of Daily Living    03/20/2023    8:23 AM  In your present state of health, do you have any difficulty performing the following activities:  Hearing? 0  Vision? 0  Difficulty concentrating or making decisions? 0  Walking or climbing stairs? 0  Dressing or bathing? 0  Doing errands, shopping? 0  Preparing Food and eating ? N  Using the Toilet? N  In the past six months, have you accidently leaked urine? N  Do you have problems with loss of bowel control? N  Managing your Medications? N  Managing your Finances? N  Housekeeping or managing your Housekeeping? N    Patient Care Team: Gabriel Earing, [name] as PCP - General (Family Medicine) Michaelle Copas, MD as Referring Physician (Optometry)  Indicate any recent Medical Services you may have received from other than [facility] providers in the past year (date may be approximate).     Assessment:   This is a routine wellness examination for Anthony Espinoza.  Hearing/Vision screen Vision Screening - Comments:: Wears rx glasses - up to date with routine eye  exams with  Dr.Lee   Dietary issues and exercise activities discussed: Current Exercise Habits: Home exercise routine, Type of exercise: walking, Time (Minutes): 30, Frequency (Times/Week): 3, Weekly Exercise (Minutes/Week): 90, Intensity: Mild, Exercise limited by: None identified   Goals Addressed             This Visit's Progress    DIET - EAT MORE FRUITS AND VEGETABLES   On track      Depression Screen    03/20/2023    8:22 AM 03/01/2023    8:33 AM 11/30/2022    8:31 AM 08/01/2022    8:00 AM 07/06/2022    1:12 PM 04/27/2022    9:35 AM 03/14/2022    8:25 AM  PHQ 2/9 Scores  PHQ - 2 Score 0 0 0 0 0 0 0  PHQ- 9 Score 0 0 0  0 0     Fall Risk    03/20/2023    8:21 AM 03/01/2023    8:33 AM 11/30/2022    8:31 AM 08/01/2022    7:51 AM 07/06/2022    1:12 PM  Fall Risk   Falls in the past year? 0 0 0 0 0  Number falls in past yr: 0      Injury with Fall? 0      Risk for fall due to : No Fall Risks      Follow up Falls prevention discussed        FALL RISK PREVENTION PERTAINING TO THE HOME:  Any stairs in or around the home? No  If so, are there any without handrails? No  Home free of loose throw rugs in walkways, pet beds, electrical cords, etc? Yes  Adequate lighting in your home to reduce risk of falls? Yes   ASSISTIVE DEVICES UTILIZED TO PREVENT FALLS:  Life alert? No  Use of a cane, walker or w/c? No  Grab bars in the bathroom? No  Shower chair or bench in shower? No  Elevated toilet seat or a handicapped toilet? No  03/20/2023    8:23 AM 03/14/2022    8:30 AM 03/03/2021   10:04 AM 03/02/2020   10:31 AM  6CIT Screen  What Year? 0 points 0 points 0 points 0 points  What month? 0 points 0 points 0 points 0 points  What time? 0 points 0 points 0 points 0 points  Count back from 20 0 points 0 points 4 points 0 points  Months in reverse 0 points 4 points 0 points 0 points  Repeat phrase 0 points 0 points 0 points 4 points  Total Score 0 points 4 points 4  points 4 points    Immunizations Immunization History  Administered Date(s) Administered   Fluad Quad(high Dose 65+) 08/15/2016, 09/06/2017, 09/18/2018, 08/25/2020   Influenza, High Dose Seasonal PF 08/15/2016, 09/06/2017, 09/18/2018   Influenza, Quadrivalent, Recombinant, Inj, Pf 08/25/2019   Influenza, Seasonal, Injecte, Preservative Fre 09/30/2015   Influenza,trivalent, recombinat, inj, PF 08/26/2014   Influenza-Unspecified 08/26/2014, 09/30/2015, 08/15/2016, 09/06/2017, 09/18/2018, 08/31/2021   Moderna SARS-COV2 Booster Vaccination 02/13/2021   Moderna Sars-Covid-2 Vaccination 02/17/2020, 03/15/2020, 09/22/2020, 02/13/2021   Pneumococcal Conjugate-13 06/14/2016, 08/25/2019   Pneumococcal Polysaccharide-23 09/06/2017   Tdap 03/05/1999, 01/26/2021   Zoster Recombinat (Shingrix) 03/19/2022, 05/19/2022    TDAP status: Up to date  Flu Vaccine status: Up to date  Pneumococcal vaccine status: Up to date  Covid-19 vaccine status: Completed vaccines  Qualifies for Shingles Vaccine? Yes   Zostavax completed Yes   Shingrix Completed?: Yes  Screening Tests Health Maintenance  Topic Date Due   COVID-19 Vaccine (6 - 2023-24 season) 12/17/2023 (Originally 07/13/2022)   Diabetic kidney evaluation - Urine ACR  04/28/2023   FOOT EXAM  04/28/2023   INFLUENZA VACCINE  06/13/2023   Diabetic kidney evaluation - eGFR measurement  08/02/2023   HEMOGLOBIN A1C  08/31/2023   Fecal DNA (Cologuard)  02/08/2024   OPHTHALMOLOGY EXAM  02/11/2024   Medicare Annual Wellness (AWV)  03/19/2024   DTaP/Tdap/Td (3 - Td or Tdap) 01/27/2031   Pneumonia Vaccine 80+ Years old  Completed   Hepatitis C Screening  Completed   Zoster Vaccines- Shingrix  Completed   HPV VACCINES  Aged Out   COLONOSCOPY (Pts 45-52yrs Insurance coverage will need to be confirmed)  Discontinued    Health Maintenance  There are no preventive care reminders to display for this patient.   Colorectal cancer screening: Type of  screening: Cologuard. Completed 02/07/2021. Repeat every 3 years  Lung Cancer Screening: (Low Dose CT Chest recommended if Age 57-80 years, 30 pack-year currently smoking OR have quit w/in 15years.) does not qualify.   Lung Cancer Screening Referral: n/a  Additional Screening:  Hepatitis C Screening: does not qualify; Completed 07/22/2017  Vision Screening: Recommended annual ophthalmology exams for early detection of glaucoma and other disorders of the eye. Is the patient up to date with their annual eye exam?  Yes  Who is the provider or what is the name of the office in which the patient attends annual eye exams? Dr.Lee  If pt is not established with a provider, would they like to be referred to a provider to establish care? No .   Dental Screening: Recommended annual dental exams for proper oral hygiene  Community Resource Referral / Chronic Care Management: CRR required this visit?  No   CCM required this visit?  No      Plan:     I have personally reviewed and noted the following in the patient's chart:   Medical and social history Use of  alcohol, tobacco or illicit drugs  Current medications and supplements including opioid prescriptions. Patient is not currently taking opioid prescriptions. Functional ability and status Nutritional status Physical activity Advanced directives List of other physicians Hospitalizations, surgeries, and ER visits in previous 12 months Vitals Screenings to include cognitive, depression, and falls Referrals and appointments  In addition, I have reviewed and discussed with patient certain preventive protocols, quality metrics, and best practice recommendations. A written personalized care plan for preventive services as well as general preventive health recommendations were provided to patient.     Lorrene Reid, LPN   05/20/2955   Nurse Notes: none

## 2023-03-20 NOTE — Patient Instructions (Signed)
Anthony Espinoza , Thank you for taking time to come for your Medicare Wellness Visit. I appreciate your ongoing commitment to your health goals. Please review the following plan we discussed and let me know if I can assist you in the future.   These are the goals we discussed:  Goals       DIET - EAT MORE FRUITS AND VEGETABLES      Exercise 150 min/wk Moderate Activity      Obtain Annual Eye (retinal)  Exam  (pt-stated)      Has an appointment in May for eye exam        This is a list of the screening recommended for you and due dates:  Health Maintenance  Topic Date Due   COVID-19 Vaccine (6 - 2023-24 season) 12/17/2023*   Yearly kidney health urinalysis for diabetes  04/28/2023   Complete foot exam   04/28/2023   Flu Shot  06/13/2023   Yearly kidney function blood test for diabetes  08/02/2023   Hemoglobin A1C  08/31/2023   Cologuard (Stool DNA test)  02/08/2024   Eye exam for diabetics  02/11/2024   Medicare Annual Wellness Visit  03/19/2024   DTaP/Tdap/Td vaccine (3 - Td or Tdap) 01/27/2031   Pneumonia Vaccine  Completed   Hepatitis C Screening: USPSTF Recommendation to screen - Ages 68-79 yo.  Completed   Zoster (Shingles) Vaccine  Completed   HPV Vaccine  Aged Out   Colon Cancer Screening  Discontinued  *Topic was postponed. The date shown is not the original due date.    Advanced directives: Advance directive discussed with you today. I have provided a copy for you to complete at home and have notarized. Once this is complete please bring a copy in to our office so we can scan it into your chart.   Conditions/risks identified: Aim for 30 minutes of exercise or brisk walking, 6-8 glasses of water, and 5 servings of fruits and vegetables each day.   Next appointment: Follow up in one year for your annual wellness visit.   Preventive Care 11 Years and Older, Male  Preventive care refers to lifestyle choices and visits with your health care provider that can promote health  and wellness. What does preventive care include? A yearly physical exam. This is also called an annual well check. Dental exams once or twice a year. Routine eye exams. Ask your health care provider how often you should have your eyes checked. Personal lifestyle choices, including: Daily care of your teeth and gums. Regular physical activity. Eating a healthy diet. Avoiding tobacco and drug use. Limiting alcohol use. Practicing safe sex. Taking low doses of aspirin every day. Taking vitamin and mineral supplements as recommended by your health care provider. What happens during an annual well check? The services and screenings done by your health care provider during your annual well check will depend on your age, overall health, lifestyle risk factors, and family history of disease. Counseling  Your health care provider may ask you questions about your: Alcohol use. Tobacco use. Drug use. Emotional well-being. Home and relationship well-being. Sexual activity. Eating habits. History of falls. Memory and ability to understand (cognition). Work and work Astronomer. Screening  You may have the following tests or measurements: Height, weight, and BMI. Blood pressure. Lipid and cholesterol levels. These may be checked every 5 years, or more frequently if you are over 60 years old. Skin check. Lung cancer screening. You may have this screening every year starting at  age 42 if you have a 30-pack-year history of smoking and currently smoke or have quit within the past 15 years. Fecal occult blood test (FOBT) of the stool. You may have this test every year starting at age 25. Flexible sigmoidoscopy or colonoscopy. You may have a sigmoidoscopy every 5 years or a colonoscopy every 10 years starting at age 54. Prostate cancer screening. Recommendations will vary depending on your family history and other risks. Hepatitis C blood test. Hepatitis B blood test. Sexually transmitted disease  (STD) testing. Diabetes screening. This is done by checking your blood sugar (glucose) after you have not eaten for a while (fasting). You may have this done every 1-3 years. Abdominal aortic aneurysm (AAA) screening. You may need this if you are a current or former smoker. Osteoporosis. You may be screened starting at age 41 if you are at high risk. Talk with your health care provider about your test results, treatment options, and if necessary, the need for more tests. Vaccines  Your health care provider may recommend certain vaccines, such as: Influenza vaccine. This is recommended every year. Tetanus, diphtheria, and acellular pertussis (Tdap, Td) vaccine. You may need a Td booster every 10 years. Zoster vaccine. You may need this after age 61. Pneumococcal 13-valent conjugate (PCV13) vaccine. One dose is recommended after age 38. Pneumococcal polysaccharide (PPSV23) vaccine. One dose is recommended after age 56. Talk to your health care provider about which screenings and vaccines you need and how often you need them. This information is not intended to replace advice given to you by your health care provider. Make sure you discuss any questions you have with your health care provider. Document Released: 11/25/2015 Document Revised: 07/18/2016 Document Reviewed: 08/30/2015 Elsevier Interactive Patient Education  2017 [employer].  Fall Prevention in the Home Falls can cause injuries. They can happen to people of all ages. There are many things you can do to make your home safe and to help prevent falls. What can I do on the outside of my home? Regularly fix the edges of walkways and driveways and fix any cracks. Remove anything that might make you trip as you walk through a door, such as a raised step or threshold. Trim any bushes or trees on the path to your home. Use bright outdoor lighting. Clear any walking paths of anything that might make someone trip, such as rocks or  tools. Regularly check to see if handrails are loose or broken. Make sure that both sides of any steps have handrails. Any raised decks and porches should have guardrails on the edges. Have any leaves, snow, or ice cleared regularly. Use sand or salt on walking paths during winter. Clean up any spills in your garage right away. This includes oil or grease spills. What can I do in the bathroom? Use night lights. Install grab bars by the toilet and in the tub and shower. Do not use towel bars as grab bars. Use non-skid mats or decals in the tub or shower. If you need to sit down in the shower, use a plastic, non-slip stool. Keep the floor dry. Clean up any water that spills on the floor as soon as it happens. Remove soap buildup in the tub or shower regularly. Attach bath mats securely with double-sided non-slip rug tape. Do not have throw rugs and other things on the floor that can make you trip. What can I do in the bedroom? Use night lights. Make sure that you have a light by your  bed that is easy to reach. Do not use any sheets or blankets that are too big for your bed. They should not hang down onto the floor. Have a firm chair that has side arms. You can use this for support while you get dressed. Do not have throw rugs and other things on the floor that can make you trip. What can I do in the kitchen? Clean up any spills right away. Avoid walking on wet floors. Keep items that you use a lot in easy-to-reach places. If you need to reach something above you, use a strong step stool that has a grab bar. Keep electrical cords out of the way. Do not use floor polish or wax that makes floors slippery. If you must use wax, use non-skid floor wax. Do not have throw rugs and other things on the floor that can make you trip. What can I do with my stairs? Do not leave any items on the stairs. Make sure that there are handrails on both sides of the stairs and use them. Fix handrails that are  broken or loose. Make sure that handrails are as long as the stairways. Check any carpeting to make sure that it is firmly attached to the stairs. Fix any carpet that is loose or worn. Avoid having throw rugs at the top or bottom of the stairs. If you do have throw rugs, attach them to the floor with carpet tape. Make sure that you have a light switch at the top of the stairs and the bottom of the stairs. If you do not have them, ask someone to add them for you. What else can I do to help prevent falls? Wear shoes that: Do not have high heels. Have rubber bottoms. Are comfortable and fit you well. Are closed at the toe. Do not wear sandals. If you use a stepladder: Make sure that it is fully opened. Do not climb a closed stepladder. Make sure that both sides of the stepladder are locked into place. Ask someone to hold it for you, if possible. Clearly mark and make sure that you can see: Any grab bars or handrails. First and last steps. Where the edge of each step is. Use tools that help you move around (mobility aids) if they are needed. These include: Canes. Walkers. Scooters. Crutches. Turn on the lights when you go into a dark area. Replace any light bulbs as soon as they burn out. Set up your furniture so you have a clear path. Avoid moving your furniture around. If any of your floors are uneven, fix them. If there are any pets around you, be aware of where they are. Review your medicines with your doctor. Some medicines can make you feel dizzy. This can increase your chance of falling. Ask your doctor what other things that you can do to help prevent falls. This information is not intended to replace advice given to you by your health care provider. Make sure you discuss any questions you have with your health care provider. Document Released: 08/25/2009 Document Revised: 04/05/2016 Document Reviewed: 12/03/2014 Elsevier Interactive Patient Education  2017 [employer].

## 2023-06-03 ENCOUNTER — Ambulatory Visit (INDEPENDENT_AMBULATORY_CARE_PROVIDER_SITE_OTHER): Payer: Medicare HMO | Admitting: Family Medicine

## 2023-06-03 ENCOUNTER — Encounter: Payer: Self-pay | Admitting: Family Medicine

## 2023-06-03 VITALS — BP 119/75 | HR 92 | Temp 98.5°F | Ht 73.0 in | Wt 227.0 lb

## 2023-06-03 DIAGNOSIS — E1169 Type 2 diabetes mellitus with other specified complication: Secondary | ICD-10-CM | POA: Diagnosis not present

## 2023-06-03 DIAGNOSIS — N400 Enlarged prostate without lower urinary tract symptoms: Secondary | ICD-10-CM

## 2023-06-03 DIAGNOSIS — E785 Hyperlipidemia, unspecified: Secondary | ICD-10-CM

## 2023-06-03 DIAGNOSIS — E1159 Type 2 diabetes mellitus with other circulatory complications: Secondary | ICD-10-CM | POA: Diagnosis not present

## 2023-06-03 DIAGNOSIS — E1165 Type 2 diabetes mellitus with hyperglycemia: Secondary | ICD-10-CM | POA: Diagnosis not present

## 2023-06-03 DIAGNOSIS — I152 Hypertension secondary to endocrine disorders: Secondary | ICD-10-CM

## 2023-06-03 DIAGNOSIS — Z7984 Long term (current) use of oral hypoglycemic drugs: Secondary | ICD-10-CM | POA: Diagnosis not present

## 2023-06-03 LAB — CMP14+EGFR

## 2023-06-03 LAB — PSA, TOTAL AND FREE

## 2023-06-03 LAB — CBC WITH DIFFERENTIAL/PLATELET
EOS (ABSOLUTE): 0.3 10*3/uL (ref 0.0–0.4)
Eos: 5 %
Hematocrit: 47.4 % (ref 37.5–51.0)
Immature Grans (Abs): 0 10*3/uL (ref 0.0–0.1)
Lymphocytes Absolute: 1.8 10*3/uL (ref 0.7–3.1)
Lymphs: 29 %
MCH: 31.4 pg (ref 26.6–33.0)
MCV: 91 fL (ref 79–97)
Monocytes Absolute: 0.6 10*3/uL (ref 0.1–0.9)

## 2023-06-03 LAB — BAYER DCA HB A1C WAIVED: HB A1C (BAYER DCA - WAIVED): 6.9 % — ABNORMAL HIGH (ref 4.8–5.6)

## 2023-06-03 MED ORDER — DAPAGLIFLOZIN PROPANEDIOL 10 MG PO TABS
10.0000 mg | ORAL_TABLET | Freq: Every day | ORAL | 3 refills | Status: DC
Start: 2023-06-03 — End: 2023-12-05

## 2023-06-03 NOTE — Progress Notes (Signed)
Established Patient Office Visit  Subjective   Patient ID: Anthony Espinoza, male    DOB: [birth year]  Age: 72 y.o. MRN: [id number]  Chief Complaint  Patient presents with   Medical Management of Chronic Issues    3 month follow up    HPI DM Pt presents for follow up evaluation of Type 2 diabetes mellitus.  Current symptoms include none.   Current diabetic medications include farxiga 10 mg, glipizide 10 mg, metformin 500 mg BID Compliant with meds - Yes  Current monitoring regimen: none  Eye exam current (within one year): no  Current diet:  low carb   Current exercise: gardening and yard work  Urine microalbumin UTD? Yes Is He on ACE inhibitor or angiotensin II receptor blocker?  Yes, lisinopril Is He on statin? Yes atorvastatin Is He on ASA 81 mg daily?  Yes  2. HTN Complaint with meds - Yes Current Medications - amlodipine 10 mg, lisinopril 2.5, metoprolol 75 mg Checking BP at home - no Pertinent ROS:  Headache - No Fatigue - No Visual Disturbances - No Chest pain - No Dyspnea - No Palpitations - No LE edema - No  He has declined EKG and further evaluation of tachycardia numerous times in the past.     Past Medical History:  Diagnosis Date   Basal cell carcinoma (BCC) of back 01/30/2022   Dr. Orvan Falconer   Diabetes mellitus without complication (HCC)    Hyperlipidemia    Hypertension       ROS As per HPI.    Objective:     BP 119/75   Pulse 92   Temp 98.5 F (36.9 C)   Ht 6\' 1"  (1.854 m)   Wt 227 lb (103 kg)   SpO2 97%   BMI 29.95 kg/m     Physical Exam Vitals and nursing note reviewed.  Constitutional:      General: He is not in acute distress.    Appearance: He is not ill-appearing, toxic-appearing or diaphoretic.  Neck:     Thyroid: No thyroid mass, thyromegaly or thyroid tenderness.     Vascular: No carotid bruit.  Cardiovascular:     Rate and Rhythm: Regular rhythm. Tachycardia present.     Heart sounds: Normal heart sounds. No  murmur heard. Pulmonary:     Effort: Pulmonary effort is normal.     Breath sounds: Normal breath sounds.  Musculoskeletal:     Cervical back: No rigidity.     Right lower leg: No edema.     Left lower leg: No edema.  Lymphadenopathy:     Cervical: No cervical adenopathy.  Skin:    General: Skin is warm and dry.  Neurological:     General: No focal deficit present.     Mental Status: He is alert and oriented to person, place, and time.  Psychiatric:        Mood and Affect: Mood normal.        Behavior: Behavior normal.      No results found for any visits on 06/03/23.    The ASCVD Risk score (Arnett DK, et al., 2019) failed to calculate for the following reasons:   The valid total cholesterol range is 130 to 320 mg/dL    Assessment & Plan:   Sabri was seen today for medical management of chronic issues.  Diagnoses and all orders for this visit:  Type 2 diabetes mellitus with hyperglycemia, without long-term current use of insulin (HCC) A1c 6.9 today, at goal  of <7. Medication changes today: none. He is on an ACE/ARB and statin. Eye exam: UTD. Foot exam: today. Urine micro: today. Diet and exercise.  -     Bayer DCA Hb A1c Waived -     CMP14+EGFR -     Vitamin B12 -     Microalbumin / creatinine urine ratio -     dapagliflozin propanediol (FARXIGA) 10 MG TABS tablet; Take 1 tablet (10 mg total) by mouth daily.  Hyperlipidemia associated with type 2 diabetes mellitus (HCC) On statin. Last LDL at 58.  Hypertension associated with diabetes (HCC) Well controlled on current regimen. ; -     CBC with Differential/Platelet  Benign prostatic hyperplasia without lower urinary tract symptoms Labs pending. On flomax. Denies symptoms.  -     PSA, total and free   Return in about 6 months (around 12/04/2023) for CPE.   The patient indicates understanding of these issues and agrees with the plan.  Gabriel Earing, [name]

## 2023-06-04 LAB — MICROALBUMIN / CREATININE URINE RATIO
Creatinine, Urine: 33.1 mg/dL
Microalb/Creat Ratio: <9 mg/g creat (ref 0–29)
Microalbumin, Urine: <3 ug/mL

## 2023-06-04 LAB — CBC WITH DIFFERENTIAL/PLATELET
Basophils Absolute: 0.1 10*3/uL (ref 0.0–0.2)
Basos: 1 %
Hemoglobin: 16.4 g/dL (ref 13.0–17.7)
Immature Granulocytes: 0 %
MCHC: 34.6 g/dL (ref 31.5–35.7)
Monocytes: 10 %
Neutrophils Absolute: 3.4 10*3/uL (ref 1.4–7.0)
Neutrophils: 55 %
Platelets: 263 10*3/uL (ref 150–450)
RBC: 5.22 x10E6/uL (ref 4.14–5.80)
RDW: 13.1 % (ref 11.6–15.4)
WBC: 6.2 10*3/uL (ref 3.4–10.8)

## 2023-06-04 LAB — PSA, TOTAL AND FREE: PSA, Free: 0.52 ng/mL

## 2023-06-04 LAB — CMP14+EGFR
ALT: 23 IU/L (ref 0–44)
Alkaline Phosphatase: 70 IU/L (ref 44–121)
BUN: 14 mg/dL (ref 8–27)
Calcium: 9.2 mg/dL (ref 8.6–10.2)
Creatinine, Ser: 0.82 mg/dL (ref 0.76–1.27)
Globulin, Total: 2.4 g/dL (ref 1.5–4.5)

## 2023-06-04 LAB — VITAMIN B12: Vitamin B-12: 444 pg/mL (ref 232–1245)

## 2023-07-03 DIAGNOSIS — L570 Actinic keratosis: Secondary | ICD-10-CM | POA: Diagnosis not present

## 2023-07-03 DIAGNOSIS — Z1283 Encounter for screening for malignant neoplasm of skin: Secondary | ICD-10-CM | POA: Diagnosis not present

## 2023-07-03 DIAGNOSIS — Z85828 Personal history of other malignant neoplasm of skin: Secondary | ICD-10-CM | POA: Diagnosis not present

## 2023-07-03 DIAGNOSIS — D485 Neoplasm of uncertain behavior of skin: Secondary | ICD-10-CM | POA: Diagnosis not present

## 2023-07-05 ENCOUNTER — Other Ambulatory Visit: Payer: Self-pay | Admitting: Family Medicine

## 2023-07-05 DIAGNOSIS — M5136 Other intervertebral disc degeneration, lumbar region: Secondary | ICD-10-CM

## 2023-07-29 ENCOUNTER — Other Ambulatory Visit (HOSPITAL_COMMUNITY): Payer: Self-pay

## 2023-08-07 ENCOUNTER — Encounter: Payer: Self-pay | Admitting: Family Medicine

## 2023-08-07 ENCOUNTER — Telehealth: Payer: Medicare HMO | Admitting: Family Medicine

## 2023-08-07 DIAGNOSIS — J0100 Acute maxillary sinusitis, unspecified: Secondary | ICD-10-CM

## 2023-08-07 MED ORDER — AZITHROMYCIN 250 MG PO TABS: ORAL_TABLET | ORAL | 0 refills | Status: AC

## 2023-08-07 MED ORDER — PREDNISONE 10 MG (21) PO TBPK: ORAL_TABLET | ORAL | 0 refills | Status: DC

## 2023-08-07 MED ORDER — BENZONATATE 100 MG PO CAPS: 100.0000 mg | ORAL_CAPSULE | Freq: Two times a day (BID) | ORAL | 0 refills | Status: DC | PRN

## 2023-08-07 NOTE — Progress Notes (Signed)
   Virtual Visit via video Note   Due to COVID-19 pandemic this visit was conducted virtually. This visit type was conducted due to national recommendations for restrictions regarding the COVID-19 Pandemic (e.g. social distancing, sheltering in place) in an effort to limit this patient's exposure and mitigate transmission in our community. All issues noted in this document were discussed and addressed.  A physical exam was not performed with this format.  I connected with  Anthony Espinoza  on 08/07/23 at 1133 by video and verified that I am speaking with the correct person using two identifiers. Anthony Espinoza is currently located at home and no one is currently with him during the visit. The provider, Gabriel Earing, [name] is located in their office at time of visit.  I discussed the limitations, risks, security and privacy concerns of performing an evaluation and management service by video  and the availability of in person appointments. I also discussed with the patient that there may be a patient responsible charge related to this service. The patient expressed understanding and agreed to proceed.  CC: cough  History and Present Illness:  Anthony Espinoza reports nonproductive cough for 2-3 weeks without improvement. He also reports nasal congestion with maxillary tenderness. Denies sore throat, chest pain, shortness of breath, fever, chills, or wheezing. He has been taking nyquil, mucinex, theraflu without improvement. Reports hx of sinus infections yearly that resolved with a zapk and prednisone taper.    ROS As per HPI.     Observations/Objective: Alert and oriented. Respirations unlabored. No cyanosis. Non toxic appearing. Normal mood and behavior.    Assessment and Plan: Anthony Espinoza was seen today for cough.  Diagnoses and all orders for this visit:  Acute non-recurrent maxillary sinusitis Discussed symptomatic care and return precautions. Discussed will switch to different abx if no  improvement with zpak.  -     azithromycin (ZITHROMAX) 250 MG tablet; Take 2 tablets on day 1, then 1 tablet daily on days 2 through 5 -     predniSONE (STERAPRED UNI-PAK 21 TAB) 10 MG (21) TBPK tablet; Use as directed on back of pill pack -     benzonatate (TESSALON) 100 MG capsule; Take 1 capsule (100 mg total) by mouth 2 (two) times daily as needed for cough.     Follow Up Instructions: As needed.     I discussed the assessment and treatment plan with the patient. The patient was provided an opportunity to ask questions and all were answered. The patient agreed with the plan and demonstrated an understanding of the instructions.   The patient was advised to call back or seek an in-person evaluation if the symptoms worsen or if the condition fails to improve as anticipated.  The above assessment and management plan was discussed with the patient. The patient verbalized understanding of and has agreed to the management plan. Patient is aware to call the clinic if symptoms persist or worsen. Patient is aware when to return to the clinic for a follow-up visit. Patient educated on when it is appropriate to go to the emergency department.   Time call ended: 1139  I provided 6 minutes of face-to-face time during this encounter.    Gabriel Earing, [name]

## 2023-08-13 ENCOUNTER — Telehealth: Payer: Self-pay | Admitting: Family Medicine

## 2023-08-13 DIAGNOSIS — J0100 Acute maxillary sinusitis, unspecified: Secondary | ICD-10-CM

## 2023-08-15 MED ORDER — AMOXICILLIN-POT CLAVULANATE 875-125 MG PO TABS
1.0000 | ORAL_TABLET | Freq: Two times a day (BID) | ORAL | 0 refills | Status: DC
Start: 2023-08-15 — End: 2023-08-29

## 2023-08-15 NOTE — Telephone Encounter (Signed)
Augmentin sent in.  

## 2023-08-15 NOTE — Telephone Encounter (Signed)
Pt aware rx sent in. 

## 2023-08-29 ENCOUNTER — Encounter: Payer: Self-pay | Admitting: Family Medicine

## 2023-08-29 ENCOUNTER — Ambulatory Visit: Payer: Medicare HMO | Admitting: Family Medicine

## 2023-08-29 VITALS — BP 131/78 | HR 91 | Temp 98.2°F | Ht 73.0 in | Wt 237.1 lb

## 2023-08-29 DIAGNOSIS — R0982 Postnasal drip: Secondary | ICD-10-CM | POA: Diagnosis not present

## 2023-08-29 DIAGNOSIS — M62838 Other muscle spasm: Secondary | ICD-10-CM | POA: Diagnosis not present

## 2023-08-29 DIAGNOSIS — J069 Acute upper respiratory infection, unspecified: Secondary | ICD-10-CM

## 2023-08-29 MED ORDER — AZELASTINE HCL 0.1 % NA SOLN: 2.0000 | Freq: Two times a day (BID) | NASAL | 12 refills | Status: DC

## 2023-08-29 MED ORDER — TIZANIDINE HCL 4 MG PO TABS
4.0000 mg | ORAL_TABLET | Freq: Four times a day (QID) | ORAL | 0 refills | Status: DC | PRN
Start: 2023-08-29 — End: 2023-09-24

## 2023-08-29 MED ORDER — LEVOCETIRIZINE DIHYDROCHLORIDE 5 MG PO TABS: 5.0000 mg | ORAL_TABLET | Freq: Every evening | ORAL | 3 refills | Status: DC

## 2023-08-29 NOTE — Progress Notes (Signed)
Acute Office Visit  Subjective:     Patient ID: Anthony Espinoza, male    DOB: 1950-11-20, 72 y.o.   MRN: [id number]  Chief Complaint  Patient presents with   Cough    Cough This is a recurrent problem. The current episode started more than 1 month ago. The problem has been waxing and waning. Episode frequency: worse in the evening and at night. The cough is Non-productive. Associated symptoms include headaches (frontal), nasal congestion and postnasal drip. Pertinent negatives include no chest pain, chills, ear congestion, ear pain, fever, rhinorrhea, sore throat, shortness of breath or wheezing. Treatments tried: claritin, flonase, mucinex. The treatment provided mild relief. His past medical history is significant for environmental allergies.   Ongoing dry cough for last month. Worsened again 4 days ago with new nasal congestion and sinus pressure.   His insurance is no longer covering flexeril. He likes to have this on hand to take for muscle spasms in his lower back.   Review of Systems  Constitutional:  Negative for chills and fever.  HENT:  Positive for postnasal drip. Negative for ear pain, rhinorrhea and sore throat.   Respiratory:  Positive for cough. Negative for shortness of breath and wheezing.   Cardiovascular:  Negative for chest pain.  Neurological:  Positive for headaches (frontal).  Endo/Heme/Allergies:  Positive for environmental allergies.        Objective:    BP 131/78   Pulse 91   Temp 98.2 F (36.8 C) (Temporal)   Ht 6\' 1"  (1.854 m)   Wt 237 lb 2 oz (107.6 kg)   SpO2 97%   BMI 31.28 kg/m    Physical Exam Vitals and nursing note reviewed.  Constitutional:      General: He is not in acute distress.    Appearance: He is not ill-appearing, toxic-appearing or diaphoretic.  HENT:     Head: Normocephalic and atraumatic.     Right Ear: Tympanic membrane, ear canal and external ear normal.     Left Ear: Tympanic membrane, ear canal and external ear  normal.     Nose: Congestion present.     Right Sinus: Maxillary sinus tenderness and frontal sinus tenderness present.     Left Sinus: Maxillary sinus tenderness and frontal sinus tenderness present.     Mouth/Throat:     Mouth: Mucous membranes are moist.     Pharynx: Oropharynx is clear. No oropharyngeal exudate or posterior oropharyngeal erythema.  Eyes:     General:        Right eye: No discharge.        Left eye: No discharge.     Conjunctiva/sclera: Conjunctivae normal.  Cardiovascular:     Rate and Rhythm: Normal rate and regular rhythm.     Heart sounds: Normal heart sounds. No murmur heard. Pulmonary:     Effort: Pulmonary effort is normal. No respiratory distress.     Breath sounds: Normal breath sounds. No wheezing, rhonchi or rales.  Abdominal:     General: Bowel sounds are normal. There is no distension.     Palpations: Abdomen is soft.     Tenderness: There is no abdominal tenderness. There is no guarding or rebound.  Musculoskeletal:     Cervical back: Neck supple. No rigidity.     Right lower leg: No edema.     Left lower leg: No edema.  Lymphadenopathy:     Cervical: No cervical adenopathy.  Skin:    General: Skin is warm and dry.  Neurological:     General: No focal deficit present.     Mental Status: He is alert and oriented to person, place, and time.  Psychiatric:        Mood and Affect: Mood normal.        Behavior: Behavior normal.     No results found for any visits on 08/29/23.      Assessment & Plan:   Burwell was seen today for cough.  Diagnoses and all orders for this visit:  Viral URI Symptomatic care. He will let me know if symtpoms worsen or do not improve after 7-10 days.   Postnasal drip Will switch from claritin to xyzal. Add astelin and continue flonase.  -     levocetirizine (XYZAL) 5 MG tablet; Take 1 tablet (5 mg total) by mouth every evening. -     azelastine (ASTELIN) 0.1 % nasal spray; Place 2 sprays into both nostrils 2  (two) times daily. Use in each nostril as directed  Muscle spasm Will try zanaflex prn since insurance is not covering flexeril.  -     tiZANidine (ZANAFLEX) 4 MG tablet; Take 1 tablet (4 mg total) by mouth every 6 (six) hours as needed for muscle spasms.  Return to office for new or worsening symptoms, or if symptoms persist.   The patient indicates understanding of these issues and agrees with the plan.  Gabriel Earing, [name]

## 2023-08-30 ENCOUNTER — Other Ambulatory Visit: Payer: Self-pay | Admitting: Family Medicine

## 2023-08-30 DIAGNOSIS — E1165 Type 2 diabetes mellitus with hyperglycemia: Secondary | ICD-10-CM

## 2023-09-02 ENCOUNTER — Telehealth: Payer: Self-pay | Admitting: Family Medicine

## 2023-09-02 DIAGNOSIS — J0100 Acute maxillary sinusitis, unspecified: Secondary | ICD-10-CM

## 2023-09-02 MED ORDER — CEFDINIR 300 MG PO CAPS
300.0000 mg | ORAL_CAPSULE | Freq: Two times a day (BID) | ORAL | 0 refills | Status: DC
Start: 2023-09-02 — End: 2023-12-05

## 2023-09-02 NOTE — Telephone Encounter (Signed)
Omnicef sent in for sinus infection.

## 2023-09-02 NOTE — Telephone Encounter (Signed)
Pt aware omnicef sent in.

## 2023-09-02 NOTE — Telephone Encounter (Signed)
Patient calling because PCP told him to call and let her know how he was doing. Stated that he still has a cough and congestion. His congestion gets worse when he lays down at night.

## 2023-09-23 ENCOUNTER — Other Ambulatory Visit: Payer: Self-pay | Admitting: Family Medicine

## 2023-09-23 DIAGNOSIS — M62838 Other muscle spasm: Secondary | ICD-10-CM

## 2023-10-06 ENCOUNTER — Other Ambulatory Visit: Payer: Self-pay | Admitting: Family Medicine

## 2023-10-06 DIAGNOSIS — M51369 Other intervertebral disc degeneration, lumbar region without mention of lumbar back pain or lower extremity pain: Secondary | ICD-10-CM

## 2023-10-24 ENCOUNTER — Other Ambulatory Visit: Payer: Self-pay | Admitting: Family Medicine

## 2023-10-24 DIAGNOSIS — M62838 Other muscle spasm: Secondary | ICD-10-CM

## 2023-10-24 NOTE — Telephone Encounter (Signed)
Last office visit 08/29/23 Last refill 09/24/23 #30, no refills

## 2023-12-05 ENCOUNTER — Ambulatory Visit: Payer: Medicare HMO | Admitting: Family Medicine

## 2023-12-05 ENCOUNTER — Encounter: Payer: Self-pay | Admitting: Family Medicine

## 2023-12-05 VITALS — BP 128/73 | HR 89 | Temp 98.1°F | Ht 73.0 in | Wt 232.0 lb

## 2023-12-05 DIAGNOSIS — E1165 Type 2 diabetes mellitus with hyperglycemia: Secondary | ICD-10-CM | POA: Diagnosis not present

## 2023-12-05 DIAGNOSIS — M5136 Other intervertebral disc degeneration, lumbar region with discogenic back pain only: Secondary | ICD-10-CM | POA: Diagnosis not present

## 2023-12-05 DIAGNOSIS — E1159 Type 2 diabetes mellitus with other circulatory complications: Secondary | ICD-10-CM | POA: Diagnosis not present

## 2023-12-05 DIAGNOSIS — Z0000 Encounter for general adult medical examination without abnormal findings: Secondary | ICD-10-CM

## 2023-12-05 DIAGNOSIS — E785 Hyperlipidemia, unspecified: Secondary | ICD-10-CM

## 2023-12-05 DIAGNOSIS — E1169 Type 2 diabetes mellitus with other specified complication: Secondary | ICD-10-CM

## 2023-12-05 DIAGNOSIS — Z7984 Long term (current) use of oral hypoglycemic drugs: Secondary | ICD-10-CM | POA: Diagnosis not present

## 2023-12-05 DIAGNOSIS — Z0001 Encounter for general adult medical examination with abnormal findings: Secondary | ICD-10-CM

## 2023-12-05 LAB — BAYER DCA HB A1C WAIVED: HB A1C (BAYER DCA - WAIVED): 7.8 % — ABNORMAL HIGH (ref 4.8–5.6)

## 2023-12-05 MED ORDER — OXYCODONE-ACETAMINOPHEN 5-325 MG PO TABS: 1.0000 | ORAL_TABLET | ORAL | 0 refills | Status: AC | PRN

## 2023-12-05 MED ORDER — EMPAGLIFLOZIN 25 MG PO TABS: 25.0000 mg | ORAL_TABLET | Freq: Every day | ORAL | 3 refills | Status: DC

## 2023-12-05 NOTE — Progress Notes (Signed)
Complete physical exam  Patient: Anthony Espinoza   DOB: 05-Apr-1951   73 y.o. Male  MRN: [id number]  Subjective:    Chief Complaint  Patient presents with   Annual Exam    Anthony Espinoza is a 73 y.o. male who presents today for a complete physical exam. He reports consuming a general diet. The patient does not participate in regular exercise at present. He generally feels well. He reports sleeping well. He does not have additional problems to discuss today.   He would like to try another medication besides farxia due the cost with his insurance.   He would also like a refill of pain medication for his back. He has had increased pain lately with the cold weather and not being as active out in the yard. Last fill of this was in April of 2024. He takes this very sparingly.   Most recent fall risk assessment:    12/05/2023    8:11 AM  Fall Risk   Falls in the past year? 0     Most recent depression screenings:    08/29/2023    9:18 AM 06/03/2023    9:24 AM  PHQ 2/9 Scores  PHQ - 2 Score 0 0  PHQ- 9 Score 0 0    Vision:Within last year and Dental: No current dental problems and Receives regular dental care  Past Medical History:  Diagnosis Date   Basal cell carcinoma (BCC) of back 01/30/2022   Dr. Orvan Falconer   Diabetes mellitus without complication (HCC)    Hyperlipidemia    Hypertension       Patient Care Team: Gabriel Earing, [name] as PCP - General (Family Medicine) Michaelle Copas, MD as Referring Physician [facility])   Outpatient Medications Prior to Visit  Medication Sig   amLODipine (NORVASC) 10 MG tablet Take 1 tablet (10 mg total) by mouth daily.   aspirin 81 MG chewable tablet Chew 81 mg by mouth daily.   atorvastatin (LIPITOR) 20 MG tablet Take 1 tablet (20 mg total) by mouth daily. for cholesterol.   azelastine (ASTELIN) 0.1 % nasal spray Place 2 sprays into both nostrils 2 (two) times daily. Use in each nostril as directed   dapagliflozin propanediol  (FARXIGA) 10 MG TABS tablet Take 1 tablet (10 mg total) by mouth daily.   glipiZIDE (GLUCOTROL XL) 10 MG 24 hr tablet Take 1 tablet (10 mg total) by mouth daily.   ibuprofen (ADVIL) 800 MG tablet TAKE 1 TABLET BY MOUTH EVERY 8 HOURS AS NEEDED   levocetirizine (XYZAL) 5 MG tablet Take 1 tablet (5 mg total) by mouth every evening.   lisinopril (ZESTRIL) 2.5 MG tablet Take 1 tablet (2.5 mg total) by mouth daily.   metFORMIN (GLUCOPHAGE-XR) 500 MG 24 hr tablet TAKE 1 TABLET BY MOUTH IN THE MORNING AND 1 AT BEDTIME   metoprolol succinate (TOPROL-XL) 25 MG 24 hr tablet Take 1 tablet (25 mg total) by mouth daily. Take with 50 mg ER tablet.   metoprolol succinate (TOPROL-XL) 50 MG 24 hr tablet Take 1 tablet (50 mg total) by mouth daily.   tamsulosin (FLOMAX) 0.4 MG CAPS capsule 1 capsule daily   tiZANidine (ZANAFLEX) 4 MG tablet TAKE 1 TABLET BY MOUTH EVERY 6 HOURS AS NEEDED FOR MUSCLE SPASM   [DISCONTINUED] cefdinir (OMNICEF) 300 MG capsule Take 1 capsule (300 mg total) by mouth 2 (two) times daily. 1 po BID   No facility-administered medications prior to visit.    ROS Negative  unless specially indicated above in HPI.     Objective:     BP 128/73   Pulse 89   Temp 98.1 F (36.7 C) (Temporal)   Ht 6\' 1"  (1.854 m)   Wt 232 lb (105.2 kg)   SpO2 97%   BMI 30.61 kg/m    Physical Exam Vitals and nursing note reviewed.  Constitutional:      General: He is not in acute distress.    Appearance: He is obese. He is not ill-appearing, toxic-appearing or diaphoretic.  HENT:     Head: Normocephalic and atraumatic.     Right Ear: Tympanic membrane, ear canal and external ear normal.     Left Ear: Tympanic membrane, ear canal and external ear normal.     Nose: Nose normal.     Mouth/Throat:     Mouth: Mucous membranes are moist.     Pharynx: Oropharynx is clear.  Eyes:     Extraocular Movements: Extraocular movements intact.     Conjunctiva/sclera: Conjunctivae normal.     Pupils: Pupils  are equal, round, and reactive to light.  Cardiovascular:     Rate and Rhythm: Normal rate and regular rhythm.     Pulses: Normal pulses.     Heart sounds: Normal heart sounds. No murmur heard.    No friction rub. No gallop.  Pulmonary:     Effort: Pulmonary effort is normal.     Breath sounds: Normal breath sounds.  Abdominal:     General: Bowel sounds are normal. There is no distension.     Palpations: Abdomen is soft. There is no mass.     Tenderness: There is no abdominal tenderness. There is no guarding.  Musculoskeletal:     Cervical back: Normal range of motion and neck supple. No tenderness.     Right lower leg: No edema.     Left lower leg: No edema.  Skin:    General: Skin is warm and dry.     Capillary Refill: Capillary refill takes less than 2 seconds.     Findings: No lesion or rash.  Neurological:     General: No focal deficit present.     Mental Status: He is alert and oriented to person, place, and time.  Psychiatric:        Mood and Affect: Mood normal.        Behavior: Behavior normal.        Thought Content: Thought content normal.      No results found for any visits on 12/05/23.     Assessment & Plan:    Anthony Espinoza was seen today for annual exam.  Diagnoses and all orders for this visit:  Routine general medical examination at a health care facility  Type 2 diabetes mellitus with hyperglycemia, without long-term current use of insulin (HCC) A1c pending today. On ACE and statin. Will switch from farxiga to jardiance due to cost of medication.  -     Bayer DCA Hb A1c Waived -     empagliflozin (JARDIANCE) 25 MG TABS tablet; Take 1 tablet (25 mg total) by mouth daily before breakfast.  Hypertension associated with diabetes (HCC) BP at goal.  -     CBC with Differential/Platelet -     CMP14+EGFR -     TSH  Hyperlipidemia associated with type 2 diabetes mellitus (HCC) Fasting panel pending. On statin.  -     Lipid panel  Lumbar degenerative disc  disease PDMP reviewed, no red flags. Refill provided,  aware to use sparingly. No red flags.  -     oxyCODONE-acetaminophen (PERCOCET/ROXICET) 5-325 MG tablet; Take 1 tablet by mouth every 4 (four) hours as needed for up to 5 days for severe pain (pain score 7-10).   Immunization History  Administered Date(s) Administered   Fluad Quad(high Dose 65+) 08/15/2016, 09/06/2017, 09/18/2018, 08/25/2020   Influenza, High Dose Seasonal PF 09/30/2015, 08/15/2016, 09/06/2017, 09/18/2018   Influenza, Quadrivalent, Recombinant, Inj, Pf 08/25/2019   Influenza, Seasonal, Injecte, Preservative Fre 09/30/2015   Influenza,trivalent, recombinat, inj, PF 08/26/2014   Influenza-Unspecified 08/26/2014, 09/30/2015, 08/15/2016, 09/06/2017, 09/18/2018, 08/31/2021   Moderna SARS-COV2 Booster Vaccination 02/13/2021   Moderna Sars-Covid-2 Vaccination 02/17/2020, 03/15/2020, 09/22/2020, 02/13/2021   Pneumococcal Conjugate-13 06/14/2016, 08/25/2019   Pneumococcal Polysaccharide-23 09/06/2017   Tdap 03/05/1999, 01/26/2021   Zoster Recombinant(Shingrix) 03/19/2022, 05/19/2022    Health Maintenance  Topic Date Due   HEMOGLOBIN A1C  12/04/2023   INFLUENZA VACCINE  02/10/2024 (Originally 06/13/2023)   COVID-19 Vaccine (6 - 2024-25 season) 09/13/2024 (Originally 07/14/2023)   Fecal DNA (Cologuard)  02/08/2024   OPHTHALMOLOGY EXAM  02/14/2024   Medicare Annual Wellness (AWV)  03/19/2024   Diabetic kidney evaluation - eGFR measurement  06/02/2024   Diabetic kidney evaluation - Urine ACR  06/02/2024   FOOT EXAM  06/02/2024   DTaP/Tdap/Td (3 - Td or Tdap) 01/27/2031   Pneumonia Vaccine 24+ Years old  Completed   Hepatitis C Screening  Completed   Zoster Vaccines- Shingrix  Completed   HPV VACCINES  Aged Out   Colonoscopy  Discontinued    Discussed health benefits of physical activity, and encouraged him to engage in regular exercise appropriate for his age and condition.  Problem List Items Addressed This Visit        Cardiovascular and Mediastinum   Hypertension associated with diabetes (HCC)   Relevant Medications   empagliflozin (JARDIANCE) 25 MG TABS tablet   Other Relevant Orders   CBC with Differential/Platelet   CMP14+EGFR     Endocrine   Hyperlipidemia associated with type 2 diabetes mellitus (HCC)   Relevant Medications   empagliflozin (JARDIANCE) 25 MG TABS tablet   Other Relevant Orders   Lipid panel     Musculoskeletal and Integument   Lumbar degenerative disc disease   Relevant Medications   oxyCODONE-acetaminophen (PERCOCET/ROXICET) 5-325 MG tablet   Other Visit Diagnoses       Routine general medical examination at a health care facility    -  Primary     Type 2 diabetes mellitus with hyperglycemia, without long-term current use of insulin (HCC)       Relevant Medications   empagliflozin (JARDIANCE) 25 MG TABS tablet   Other Relevant Orders   TSH   Bayer DCA Hb A1c Waived      Return in about 6 months (around 06/03/2024) for chronic follow up.   The patient indicates understanding of these issues and agrees with the plan.  Gabriel Earing, [name]

## 2023-12-05 NOTE — Patient Instructions (Signed)
Health Maintenance, Male Adopting a healthy lifestyle and getting preventive care are important in promoting health and wellness. Ask your health care provider about: The right schedule for you to have regular tests and exams. Things you can do on your own to prevent diseases and keep yourself healthy. What should I know about diet, weight, and exercise? Eat a healthy diet  Eat a diet that includes plenty of vegetables, fruits, low-fat dairy products, and lean protein. Do not eat a lot of foods that are high in solid fats, added sugars, or sodium. Maintain a healthy weight Body mass index (BMI) is a measurement that can be used to identify possible weight problems. It estimates body fat based on height and weight. Your health care provider can help determine your BMI and help you achieve or maintain a healthy weight. Get regular exercise Get regular exercise. This is one of the most important things you can do for your health. Most adults should: Exercise for at least 150 minutes each week. The exercise should increase your heart rate and make you sweat (moderate-intensity exercise). Do strengthening exercises at least twice a week. This is in addition to the moderate-intensity exercise. Spend less time sitting. Even light physical activity can be beneficial. Watch cholesterol and blood lipids Have your blood tested for lipids and cholesterol at 73 years of age, then have this test every 5 years. You may need to have your cholesterol levels checked more often if: Your lipid or cholesterol levels are high. You are older than 73 years of age. You are at high risk for heart disease. What should I know about cancer screening? Many types of cancers can be detected early and may often be prevented. Depending on your health history and family history, you may need to have cancer screening at various ages. This may include screening for: Colorectal cancer. Prostate cancer. Skin cancer. Lung  cancer. What should I know about heart disease, diabetes, and high blood pressure? Blood pressure and heart disease High blood pressure causes heart disease and increases the risk of stroke. This is more likely to develop in people who have high blood pressure readings or are overweight. Talk with your health care provider about your target blood pressure readings. Have your blood pressure checked: Every 3-5 years if you are 18-39 years of age. Every year if you are 40 years old or older. If you are between the ages of 65 and 75 and are a current or former smoker, ask your health care provider if you should have a one-time screening for abdominal aortic aneurysm (AAA). Diabetes Have regular diabetes screenings. This checks your fasting blood sugar level. Have the screening done: Once every three years after age 45 if you are at a normal weight and have a low risk for diabetes. More often and at a younger age if you are overweight or have a high risk for diabetes. What should I know about preventing infection? Hepatitis B If you have a higher risk for hepatitis B, you should be screened for this virus. Talk with your health care provider to find out if you are at risk for hepatitis B infection. Hepatitis C Blood testing is recommended for: Everyone born from 1945 through 1965. Anyone with known risk factors for hepatitis C. Sexually transmitted infections (STIs) You should be screened each year for STIs, including gonorrhea and chlamydia, if: You are sexually active and are younger than 73 years of age. You are older than 73 years of age and your   health care provider tells you that you are at risk for this type of infection. Your sexual activity has changed since you were last screened, and you are at increased risk for chlamydia or gonorrhea. Ask your health care provider if you are at risk. Ask your health care provider about whether you are at high risk for HIV. Your health care provider  may recommend a prescription medicine to help prevent HIV infection. If you choose to take medicine to prevent HIV, you should first get tested for HIV. You should then be tested every 3 months for as long as you are taking the medicine. Follow these instructions at home: Alcohol use Do not drink alcohol if your health care provider tells you not to drink. If you drink alcohol: Limit how much you have to 0-2 drinks a day. Know how much alcohol is in your drink. In the [place] one drink equals one 12 oz bottle of beer (355 mL), one 5 oz glass of wine (148 mL), or one 1 oz glass of hard liquor (44 mL). Lifestyle Do not use any products that contain nicotine or tobacco. These products include cigarettes, chewing tobacco, and vaping devices, such as e-cigarettes. If you need help quitting, ask your health care provider. Do not use street drugs. Do not share needles. Ask your health care provider for help if you need support or information about quitting drugs. General instructions Schedule regular health, dental, and eye exams. Stay current with your vaccines. Tell your health care provider if: You often feel depressed. You have ever been abused or do not feel safe at home. Summary Adopting a healthy lifestyle and getting preventive care are important in promoting health and wellness. Follow your health care provider's instructions about healthy diet, exercising, and getting tested or screened for diseases. Follow your health care provider's instructions on monitoring your cholesterol and blood pressure. This information is not intended to replace advice given to you by your health care provider. Make sure you discuss any questions you have with your health care provider. Document Revised: 03/20/2021 Document Reviewed: 03/20/2021 Elsevier Patient Education  2024 Elsevier Inc.  

## 2023-12-06 ENCOUNTER — Encounter: Payer: Self-pay | Admitting: Family Medicine

## 2023-12-06 ENCOUNTER — Encounter: Payer: Self-pay | Admitting: *Deleted

## 2023-12-06 ENCOUNTER — Other Ambulatory Visit: Payer: Self-pay | Admitting: Family Medicine

## 2023-12-06 ENCOUNTER — Telehealth: Payer: Self-pay | Admitting: Family Medicine

## 2023-12-06 DIAGNOSIS — E1165 Type 2 diabetes mellitus with hyperglycemia: Secondary | ICD-10-CM

## 2023-12-06 LAB — CBC WITH DIFFERENTIAL/PLATELET
Basophils Absolute: 0.1 10*3/uL (ref 0.0–0.2)
Basos: 1 %
EOS (ABSOLUTE): 0.4 10*3/uL (ref 0.0–0.4)
Eos: 7 %
Hematocrit: 48.3 % (ref 37.5–51.0)
Hemoglobin: 16.1 g/dL (ref 13.0–17.7)
Immature Grans (Abs): 0 10*3/uL (ref 0.0–0.1)
Immature Granulocytes: 0 %
Lymphocytes Absolute: 1.7 10*3/uL (ref 0.7–3.1)
Lymphs: 28 %
MCH: 31.3 pg (ref 26.6–33.0)
MCHC: 33.3 g/dL (ref 31.5–35.7)
MCV: 94 fL (ref 79–97)
Monocytes Absolute: 0.6 10*3/uL (ref 0.1–0.9)
Monocytes: 9 %
Neutrophils Absolute: 3.3 10*3/uL (ref 1.4–7.0)
Neutrophils: 55 %
Platelets: 272 10*3/uL (ref 150–450)
RBC: 5.14 x10E6/uL (ref 4.14–5.80)
RDW: 12.1 % (ref 11.6–15.4)
WBC: 6 10*3/uL (ref 3.4–10.8)

## 2023-12-06 LAB — LIPID PANEL
Chol/HDL Ratio: 2.8 {ratio} (ref 0.0–5.0)
Cholesterol, Total: 127 mg/dL (ref 100–199)
HDL: 45 mg/dL (ref >39)
LDL Chol Calc (NIH): 61 mg/dL (ref 0–99)
Triglycerides: 117 mg/dL (ref 0–149)
VLDL Cholesterol Cal: 21 mg/dL (ref 5–40)

## 2023-12-06 LAB — CMP14+EGFR
ALT: 19 [IU]/L (ref 0–44)
AST: 18 [IU]/L (ref 0–40)
Albumin: 4.6 g/dL (ref 3.8–4.8)
Alkaline Phosphatase: 58 [IU]/L (ref 44–121)
BUN/Creatinine Ratio: 22 (ref 10–24)
BUN: 19 mg/dL (ref 8–27)
Bilirubin Total: 0.4 mg/dL (ref 0.0–1.2)
CO2: 18 mmol/L — ABNORMAL LOW (ref 20–29)
Calcium: 9.4 mg/dL (ref 8.6–10.2)
Chloride: 104 mmol/L (ref 96–106)
Creatinine, Ser: 0.85 mg/dL (ref 0.76–1.27)
Globulin, Total: 2.4 g/dL (ref 1.5–4.5)
Glucose: 157 mg/dL — ABNORMAL HIGH (ref 70–99)
Potassium: 4.5 mmol/L (ref 3.5–5.2)
Sodium: 139 mmol/L (ref 134–144)
Total Protein: 7 g/dL (ref 6.0–8.5)
eGFR: 92 mL/min/{1.73_m2} (ref >59)

## 2023-12-06 LAB — TSH: TSH: 1.38 u[IU]/mL (ref 0.450–4.500)

## 2023-12-06 NOTE — Telephone Encounter (Signed)
Results letter printed and mailed to pt as requested.

## 2023-12-06 NOTE — Telephone Encounter (Signed)
Copied from CRM [id number]. Topic: Clinical - Lab/Test Results >> Dec 06, 2023 12:44 PM Carloyn Manner C wrote: Reason for CRM: Patient is requesting to have his last lab record mailed to him, I offered MyChart he declined.

## 2023-12-06 NOTE — Telephone Encounter (Unsigned)
Copied from CRM [id number]. Topic: Clinical - Prescription Issue >> Dec 06, 2023  3:15 PM Ivette P wrote: Reason for CRM: Pt called in to notify Dr. Lequita Halt that he went to go pick up the new medication she prescribed and he was being charged $476. Pt refused and did not pick up medication. Wants to go back to [place] (sorry if Misspelled) Requesting call back [phone]

## 2023-12-09 MED ORDER — DAPAGLIFLOZIN PROPANEDIOL 10 MG PO TABS: 10.0000 mg | ORAL_TABLET | Freq: Every day | ORAL | 3 refills | Status: DC

## 2023-12-09 NOTE — Telephone Encounter (Signed)
Marcelline Deist sent

## 2023-12-23 ENCOUNTER — Other Ambulatory Visit: Payer: Self-pay | Admitting: Family Medicine

## 2023-12-23 DIAGNOSIS — M62838 Other muscle spasm: Secondary | ICD-10-CM

## 2024-01-13 ENCOUNTER — Other Ambulatory Visit: Payer: Self-pay | Admitting: Family Medicine

## 2024-01-13 DIAGNOSIS — N400 Enlarged prostate without lower urinary tract symptoms: Secondary | ICD-10-CM

## 2024-01-13 DIAGNOSIS — E785 Hyperlipidemia, unspecified: Secondary | ICD-10-CM

## 2024-02-03 ENCOUNTER — Other Ambulatory Visit: Payer: Self-pay | Admitting: Family Medicine

## 2024-02-03 DIAGNOSIS — E1165 Type 2 diabetes mellitus with hyperglycemia: Secondary | ICD-10-CM

## 2024-02-07 NOTE — Telephone Encounter (Unsigned)
 Copied from CRM [id number]. Topic: Clinical - Prescription Issue >> Feb 07, 2024  9:13 AM Anthony Espinoza wrote: Reason for CRM: Patient states [name] increased the metFORMIN (GLUCOPHAGE-XR) 500 MG 24 hr tablet on his last visit to take 2 in the morning and 2 in the evening. Prescriptions still shows 1 in the morning and 1 in the evening. Patient is completely out of the medication. No refill request was submitted because the prescription has not changed. Callback number is [phone]

## 2024-02-19 DIAGNOSIS — I781 Nevus, non-neoplastic: Secondary | ICD-10-CM | POA: Diagnosis not present

## 2024-02-19 DIAGNOSIS — D485 Neoplasm of uncertain behavior of skin: Secondary | ICD-10-CM | POA: Diagnosis not present

## 2024-02-19 DIAGNOSIS — Z85828 Personal history of other malignant neoplasm of skin: Secondary | ICD-10-CM | POA: Diagnosis not present

## 2024-02-20 ENCOUNTER — Ambulatory Visit: Payer: Medicare HMO | Admitting: Family Medicine

## 2024-02-20 ENCOUNTER — Encounter: Payer: Self-pay | Admitting: Family Medicine

## 2024-02-20 VITALS — BP 118/71 | HR 91 | Temp 98.3°F | Ht 73.0 in | Wt 234.4 lb

## 2024-02-20 DIAGNOSIS — E1159 Type 2 diabetes mellitus with other circulatory complications: Secondary | ICD-10-CM

## 2024-02-20 DIAGNOSIS — E1165 Type 2 diabetes mellitus with hyperglycemia: Secondary | ICD-10-CM

## 2024-02-20 DIAGNOSIS — R000 Tachycardia, unspecified: Secondary | ICD-10-CM

## 2024-02-20 DIAGNOSIS — J0140 Acute pansinusitis, unspecified: Secondary | ICD-10-CM | POA: Diagnosis not present

## 2024-02-20 DIAGNOSIS — E785 Hyperlipidemia, unspecified: Secondary | ICD-10-CM | POA: Diagnosis not present

## 2024-02-20 DIAGNOSIS — E1169 Type 2 diabetes mellitus with other specified complication: Secondary | ICD-10-CM

## 2024-02-20 DIAGNOSIS — I152 Hypertension secondary to endocrine disorders: Secondary | ICD-10-CM

## 2024-02-20 LAB — BAYER DCA HB A1C WAIVED: HB A1C (BAYER DCA - WAIVED): 7.3 % — ABNORMAL HIGH (ref 4.8–5.6)

## 2024-02-20 MED ORDER — AMOXICILLIN-POT CLAVULANATE 875-125 MG PO TABS: 1.0000 | ORAL_TABLET | Freq: Two times a day (BID) | ORAL | 0 refills | Status: AC

## 2024-02-20 NOTE — Addendum Note (Signed)
 Addended by: Gabriel Earing on: 02/20/2024 11:00 AM   Modules accepted: Level of Service

## 2024-02-20 NOTE — Progress Notes (Signed)
 Established Patient Office Visit  Subjective   Patient ID: Anthony Espinoza, male    DOB: 04/04/1951  Age: 73 y.o. MRN: [id number]  Chief Complaint  Patient presents with   Medical Management of Chronic Issues    HPI DM Pt presents for follow up evaluation of Type 2 diabetes mellitus.  Current symptoms include none.   Current diabetic medications: glipizide 10 mg, metformin 500 mg BID Compliant with meds - Yes  Current monitoring regimen: none  Eye exam current (within one year): no  Current diet:  low carb   Current exercise: gardening and yard work   2. HTN Complaint with meds - Yes Current Medications - amlodipine 10 mg, lisinopril 2.5, metoprolol 75 mg Checking BP at home - no Pertinent ROS:  Headache - No Fatigue - No Visual Disturbances - No Chest pain - No Dyspnea - No Palpitations - No LE edema - No  He has declined EKG and further evaluation of tachycardia numerous times in the past.   3. Sinus congestion Sinus pressure for 1 week, getting worse. Also with nasal congestion, left ear pain, mild dry cough. Denies sore throat, wheezing, shortness of breath, chest pain. Hasn't tried any remedies.     Past Medical History:  Diagnosis Date   Basal cell carcinoma (BCC) of back 01/30/2022   Dr. Orvan Falconer   Diabetes mellitus without complication (HCC)    Hyperlipidemia    Hypertension       ROS As per HPI.    Objective:     BP 118/71   Pulse 91   Temp 98.3 F (36.8 C) (Temporal)   Ht 6\' 1"  (1.854 m)   Wt 234 lb 6.4 oz (106.3 kg)   SpO2 95%   BMI 30.93 kg/m    Wt Readings from Last 3 Encounters:  02/20/24 234 lb 6.4 oz (106.3 kg)  12/05/23 232 lb (105.2 kg)  08/29/23 237 lb 2 oz (107.6 kg)     Physical Exam Vitals and nursing note reviewed.  Constitutional:      General: He is not in acute distress.    Appearance: He is not ill-appearing, toxic-appearing or diaphoretic.  HENT:     Right Ear: Tympanic membrane, ear canal and external  ear normal.     Left Ear: Tympanic membrane, ear canal and external ear normal.     Nose: Congestion present.     Right Sinus: Maxillary sinus tenderness and frontal sinus tenderness present.     Left Sinus: Maxillary sinus tenderness and frontal sinus tenderness present.     Mouth/Throat:     Mouth: Mucous membranes are moist.     Pharynx: Oropharynx is clear. No oropharyngeal exudate or posterior oropharyngeal erythema.  Eyes:     General:        Right eye: No discharge.     Conjunctiva/sclera: Conjunctivae normal.  Neck:     Thyroid: No thyroid mass, thyromegaly or thyroid tenderness.  Cardiovascular:     Rate and Rhythm: Regular rhythm. Tachycardia present.     Heart sounds: Normal heart sounds. No murmur heard. Pulmonary:     Effort: Pulmonary effort is normal.     Breath sounds: Normal breath sounds.  Abdominal:     General: Bowel sounds are normal. There is no distension.     Palpations: Abdomen is soft.     Tenderness: There is no abdominal tenderness. There is no guarding or rebound.  Musculoskeletal:     Cervical back: Neck supple. No rigidity.  Right lower leg: No edema.     Left lower leg: No edema.  Lymphadenopathy:     Cervical: Cervical adenopathy present.  Skin:    General: Skin is warm and dry.  Neurological:     General: No focal deficit present.     Mental Status: He is alert and oriented to person, place, and time.  Psychiatric:        Mood and Affect: Mood normal.        Behavior: Behavior normal.      No results found for any visits on 02/20/24.    The ASCVD Risk score (Arnett DK, et al., 2019) failed to calculate for the following reasons:   The valid total cholesterol range is 130 to 320 mg/dL    Assessment & Plan:   Anthony Espinoza was seen today for medical management of chronic issues.  Diagnoses and all orders for this visit:  Type 2 diabetes mellitus with hyperglycemia, without long-term current use of insulin (HCC) A1c 7.3 today, not  goal of <7 but improved from previous. Medication changes today: he declined changes today. He is on an ACE/ARB and statin. Diet and exercise.  -     Bayer DCA Hb A1c Waived  Hypertension associated with diabetes (HCC) Well controlled on current regimen.   Hyperlipidemia associated with type 2 diabetes mellitus (HCC) On statin. Last LDL 61.  Tachycardia Well controlled with metoprolol.   Acute non-recurrent pansinusitis Augmentin as below. Restart xyzal and astelin. Return to office for new or worsening symptoms, or if symptoms persist.  -     amoxicillin-clavulanate (AUGMENTIN) 875-125 MG tablet; Take 1 tablet by mouth 2 (two) times daily for 7 days.  Return in about 3 months (around 05/21/2024) for chronic follow up.   The patient indicates understanding of these issues and agrees with the plan.  Gabriel Earing, [name]

## 2024-02-24 ENCOUNTER — Other Ambulatory Visit: Payer: Self-pay | Admitting: Family Medicine

## 2024-02-24 DIAGNOSIS — E1159 Type 2 diabetes mellitus with other circulatory complications: Secondary | ICD-10-CM

## 2024-02-24 DIAGNOSIS — E1165 Type 2 diabetes mellitus with hyperglycemia: Secondary | ICD-10-CM

## 2024-02-24 DIAGNOSIS — R000 Tachycardia, unspecified: Secondary | ICD-10-CM

## 2024-02-24 MED ORDER — LISINOPRIL 2.5 MG PO TABS
2.5000 mg | ORAL_TABLET | Freq: Every day | ORAL | 0 refills | Status: DC
Start: 2024-02-24 — End: 2024-05-21

## 2024-02-24 MED ORDER — METOPROLOL SUCCINATE ER 50 MG PO TB24: 50.0000 mg | ORAL_TABLET | Freq: Every day | ORAL | 0 refills | Status: DC

## 2024-02-24 MED ORDER — AMLODIPINE BESYLATE 10 MG PO TABS
10.0000 mg | ORAL_TABLET | Freq: Every day | ORAL | 0 refills | Status: DC
Start: 2024-02-24 — End: 2024-05-21

## 2024-02-24 MED ORDER — METOPROLOL SUCCINATE ER 25 MG PO TB24
25.0000 mg | ORAL_TABLET | Freq: Every day | ORAL | 0 refills | Status: DC
Start: 2024-02-24 — End: 2024-05-21

## 2024-02-24 NOTE — Telephone Encounter (Signed)
 Oxycodone, not on current med list, is a controlled medication and can not be refilled outside of an office visit. All other requests sent to pharmacy. Pt has upcoming appt in July.

## 2024-02-24 NOTE — Telephone Encounter (Signed)
 Copied from CRM [id number]. Topic: Clinical - Medication Refill >> Feb 24, 2024  8:34 AM Opal Bill wrote: Most Recent Primary Care Visit:   Medication: amLODipine (NORVASC) 10 MG tablet metoprolol succinate (TOPROL-XL) 50 MG 24 hr tablet metoprolol succinate (TOPROL-XL) 25 MG 24 hr tablet lisinopril (ZESTRIL) 2.5 MG tablet  Med not on list: oxyCODONE-acetaminophen (PERCOCET/ROXICET) 5-325 MG tablet  Has the patient contacted their pharmacy? Yes. They have no record of getting new rxs.  Is this the correct pharmacy for this prescription? Yes If no, delete pharmacy and type the correct one.  This is the patient's preferred pharmacy:  [id number] - [place], [place] - [address] [place] [address] Phone: [phone] Fax: [phone]   Has the prescription been filled recently? Yes  Is the patient out of the medication? Yes out of all of his meds.  Has the patient been seen for an appointment in the last year OR does the patient have an upcoming appointment? Yes  Can we respond through MyChart? No  Agent: Please be advised that Rx refills may take up to 3 business days. We ask that you follow-up with your pharmacy.

## 2024-02-26 ENCOUNTER — Other Ambulatory Visit: Payer: Self-pay | Admitting: Family Medicine

## 2024-02-26 NOTE — Telephone Encounter (Signed)
 Spoke to pharmacy, verified script received and advised pt can pick up after 4 pm today. Pt made aware, verbalized understanding and agrees.   Copied from CRM [id number]. Topic: Clinical - Prescription Issue >> Feb 26, 2024 12:17 PM Anthony Espinoza wrote: Reason for CRM: Anthony Espinoza is calling in because the pharmacy didn't receive his prescription for amLODipine (NORVASC) 10 MG tablet [[id number]] but it's showing received from the pharmacy.

## 2024-02-27 ENCOUNTER — Other Ambulatory Visit: Payer: Self-pay | Admitting: Family Medicine

## 2024-02-27 DIAGNOSIS — M62838 Other muscle spasm: Secondary | ICD-10-CM

## 2024-03-02 ENCOUNTER — Other Ambulatory Visit: Payer: Self-pay | Admitting: Family Medicine

## 2024-03-02 DIAGNOSIS — M51369 Other intervertebral disc degeneration, lumbar region without mention of lumbar back pain or lower extremity pain: Secondary | ICD-10-CM

## 2024-03-05 ENCOUNTER — Other Ambulatory Visit: Payer: Self-pay | Admitting: Family Medicine

## 2024-03-05 DIAGNOSIS — L720 Epidermal cyst: Secondary | ICD-10-CM | POA: Diagnosis not present

## 2024-03-05 DIAGNOSIS — D485 Neoplasm of uncertain behavior of skin: Secondary | ICD-10-CM | POA: Diagnosis not present

## 2024-03-05 DIAGNOSIS — E1165 Type 2 diabetes mellitus with hyperglycemia: Secondary | ICD-10-CM

## 2024-03-19 DIAGNOSIS — L905 Scar conditions and fibrosis of skin: Secondary | ICD-10-CM | POA: Diagnosis not present

## 2024-03-19 DIAGNOSIS — D485 Neoplasm of uncertain behavior of skin: Secondary | ICD-10-CM | POA: Diagnosis not present

## 2024-03-19 DIAGNOSIS — D225 Melanocytic nevi of trunk: Secondary | ICD-10-CM | POA: Diagnosis not present

## 2024-03-23 ENCOUNTER — Ambulatory Visit (INDEPENDENT_AMBULATORY_CARE_PROVIDER_SITE_OTHER): Payer: Medicare HMO

## 2024-03-23 VITALS — BP 118/71 | HR 91 | Ht 73.0 in | Wt 234.0 lb

## 2024-03-23 DIAGNOSIS — Z0000 Encounter for general adult medical examination without abnormal findings: Secondary | ICD-10-CM

## 2024-03-23 NOTE — Patient Instructions (Signed)
 Anthony Espinoza , Thank you for taking time out of your busy schedule to complete your Annual Wellness Visit with me. I enjoyed our conversation and look forward to speaking with you again next year. I, as well as your care team,  appreciate your ongoing commitment to your health goals. Please review the following plan we discussed and let me know if I can assist you in the future.   Follow up Visits: Next Medicare AWV with our clinical staff: Wed.,03/24/25 @ 8:00 a.m.   Have you seen your provider in the last 6 months (3 months if uncontrolled diabetes)? Yes/02/20/24 Next Office Visit with your provider: 06/03/24 @ 8:30a.m.  Clinician Recommendations:  Aim for 30 minutes of exercise or brisk walking, 6-8 glasses of water, and 5 servings of fruits and vegetables each day. N/A      This is a list of the screening recommended for you and due dates:  Health Maintenance  Topic Date Due   Eye exam for diabetics  02/14/2024   COVID-19 Vaccine (6 - 2024-25 season) 09/13/2024*   Cologuard (Stool DNA test)  02/19/2025*   Yearly kidney health urinalysis for diabetes  06/02/2024   Complete foot exam   06/02/2024   Flu Shot  06/12/2024   Hemoglobin A1C  08/21/2024   Yearly kidney function blood test for diabetes  12/04/2024   Medicare Annual Wellness Visit  03/23/2025   DTaP/Tdap/Td vaccine (3 - Td or Tdap) 01/27/2031   Pneumonia Vaccine  Completed   Hepatitis C Screening  Completed   Zoster (Shingles) Vaccine  Completed   HPV Vaccine  Aged Out   Meningitis B Vaccine  Aged Out   Colon Cancer Screening  Discontinued  *Topic was postponed. The date shown is not the original due date.    Advanced directives: (In Chart) A copy of your advanced directives are scanned into your chart should your provider ever need it. Advance Care Planning is important because it:  [x]  Makes sure you receive the medical care that is consistent with your values, goals, and preferences  [x]  It provides guidance to your  family and loved ones and reduces their decisional burden about whether or not they are making the right decisions based on your wishes.  Follow the link provided in your after visit summary or read over the paperwork we have mailed to you to help you started getting your Advance Directives in place. If you need assistance in completing these, please reach out to us  so that we can help you!  See attachments for Preventive Care and Fall Prevention Tips.

## 2024-03-23 NOTE — Progress Notes (Signed)
 Subjective:   Anthony Espinoza is a 73 y.o. who presents for a Medicare Wellness preventive visit.  As a reminder, Annual Wellness Visits don't include a physical exam, and some assessments may be limited, especially if this visit is performed virtually. We may recommend an in-person visit if needed.  Visit Complete: Virtual I connected with  Anthony Espinoza on 03/23/24 by a audio enabled telemedicine application and verified that I am speaking with the correct person using two identifiers.  Patient Location: Home  Provider Location: Home Office  I discussed the limitations of evaluation and management by telemedicine. The patient expressed understanding and agreed to proceed.  Vital Signs: Because this visit was a virtual/telehealth visit, some criteria may be missing or patient reported. Any vitals not documented were not able to be obtained and vitals that have been documented are patient reported.  VideoDeclined- This patient declined Librarian, academic. Therefore the visit was completed with audio only.  Persons Participating in Visit: Patient.  AWV Questionnaire: No: Patient Medicare AWV questionnaire was not completed prior to this visit.  Cardiac Risk Factors include: advanced age (>50men, >55 women);diabetes mellitus;obesity (BMI >30kg/m2);dyslipidemia;hypertension;male gender     Objective:     Today's Vitals   03/23/24 0803  BP: 118/71  Pulse: 91  Weight: 234 lb (106.1 kg)  Height: 6\' 1"  (1.854 m)   Body mass index is 30.87 kg/m.     03/23/2024    8:11 AM 03/20/2023    8:23 AM 03/14/2022    8:27 AM 03/03/2021   10:03 AM 03/02/2020   10:29 AM  Advanced Directives  Does Patient Have a Medical Advance Directive? Yes Yes Yes Yes Yes  Type of Estate agent of [employer] Power of [place];Living will Healthcare Power of [place];Living will Living will Living will  Does patient want to make changes to medical  advance directive?  No - Patient declined  No - Patient declined No - Patient declined  Copy of Healthcare Power of Attorney in Chart? Yes - validated most recent copy scanned in chart (See row information) Yes - validated most recent copy scanned in chart (See row information) Yes - validated most recent copy scanned in chart (See row information)    Would patient like information on creating a medical advance directive?     No - Patient declined    Current Medications (verified) Outpatient Encounter Medications as of 03/23/2024  Medication Sig   amLODipine  (NORVASC ) 10 MG tablet Take 1 tablet (10 mg total) by mouth daily.   aspirin 81 MG chewable tablet Chew 81 mg by mouth daily.   atorvastatin  (LIPITOR) 20 MG tablet TAKE 1 TABLET BY MOUTH ONCE DAILY FOR CHOLESTEROL   glipiZIDE  (GLUCOTROL  XL) 10 MG 24 hr tablet Take 1 tablet by mouth once daily   ibuprofen  (ADVIL ) 800 MG tablet TAKE 1 TABLET BY MOUTH EVERY 8 HOURS AS NEEDED   levocetirizine (XYZAL ) 5 MG tablet Take 1 tablet (5 mg total) by mouth every evening.   lisinopril  (ZESTRIL ) 2.5 MG tablet Take 1 tablet (2.5 mg total) by mouth daily.   metFORMIN  (GLUCOPHAGE -XR) 500 MG 24 hr tablet TAKE 1 TABLET BY MOUTH IN THE MORNING AND 1 AT BEDTIME   metoprolol  succinate (TOPROL -XL) 25 MG 24 hr tablet Take 1 tablet (25 mg total) by mouth daily. Take with 50 mg ER tablet.   metoprolol  succinate (TOPROL -XL) 50 MG 24 hr tablet Take 1 tablet (50 mg total) by mouth daily.  tamsulosin  (FLOMAX ) 0.4 MG CAPS capsule Take 1 capsule by mouth once daily   tiZANidine  (ZANAFLEX ) 4 MG tablet TAKE 1 TABLET BY MOUTH EVERY 6 HOURS AS NEEDED FOR MUSCLE SPASM   azelastine  (ASTELIN ) 0.1 % nasal spray Place 2 sprays into both nostrils 2 (two) times daily. Use in each nostril as directed (Patient not taking: Reported on 03/23/2024)   No facility-administered encounter medications on file as of 03/23/2024.    Allergies (verified) Sulfa antibiotics and Other    History: Past Medical History:  Diagnosis Date   Basal cell carcinoma (BCC) of back 01/30/2022   Dr. Savannah Curlin   Diabetes mellitus without complication (HCC)    Hyperlipidemia    Hypertension    Past Surgical History:  Procedure Laterality Date   BACK SURGERY     x6   NECK SURGERY     SPINAL CORD STIMULATOR IMPLANT     Family History  Problem Relation Age of Onset   Diabetes Mother    Parkinson's disease Brother    Social History   Socioeconomic History   Marital status: Married    Spouse name: Not on file   Number of children: Not on file   Years of education: Not on file   Highest education level: Not on file  Occupational History   Occupation: Retired  Tobacco Use   Smoking status: Former    Current packs/day: 0.00    Types: Cigarettes    Quit date: 10/28/1999    Years since quitting: 24.4   Smokeless tobacco: Never  Vaping Use   Vaping status: Never Used  Substance and Sexual Activity   Alcohol use: Not Currently   Drug use: Never   Sexual activity: Not on file  Other Topics Concern   Not on file  Social History Narrative   Not on file   Social Drivers of Health   Financial Resource Strain: Low Risk  (03/23/2024)   Overall Financial Resource Strain (CARDIA)    Difficulty of Paying Living Expenses: Not hard at all  Food Insecurity: No Food Insecurity (03/23/2024)   Hunger Vital Sign    Worried About Running Out of Food in the Last Year: Never true    Ran Out of Food in the Last Year: Never true  Transportation Needs: No Transportation Needs (03/23/2024)   PRAPARE - Administrator, Civil Service (Medical): No    Lack of Transportation (Non-Medical): No  Physical Activity: Insufficiently Active (03/23/2024)   Exercise Vital Sign    Days of Exercise per Week: 2 days    Minutes of Exercise per Session: 60 min  Stress: No Stress Concern Present (03/23/2024)   Anthony Espinoza of Occupational Health - Occupational Stress Questionnaire     Feeling of Stress : Not at all  Social Connections: Moderately Isolated (03/23/2024)   Social Connection and Isolation Panel [NHANES]    Frequency of Communication with Friends and Family: More than three times a week    Frequency of Social Gatherings with Friends and Family: More than three times a week    Attends Religious Services: More than 4 times per year    Active Member of [employer] or Organizations: No    Attends Banker Meetings: Never    Marital Status: Divorced    Tobacco Counseling Counseling given: Yes    Clinical Intake:  Pre-visit preparation completed: Yes  Pain : No/denies pain     BMI - recorded: 30.87 Nutritional Status: BMI > 30  Obese Nutritional Risks:  None Diabetes: Yes CBG done?: No (per pt have not been checking per pcp)  Lab Results  Component Value Date   HGBA1C 7.3 (H) 02/20/2024   HGBA1C 7.8 (H) 12/05/2023   HGBA1C 6.9 (H) 06/03/2023     How often do you need to have someone help you when you read instructions, pamphlets, or other written materials from your doctor or pharmacy?: 1 - Never  Interpreter Needed?: No  Information entered by :: Anola Basques T/CMA   Activities of Daily Living     03/23/2024    8:08 AM  In your present state of health, do you have any difficulty performing the following activities:  Hearing? 0  Vision? 0  Comment pt wear reading glasses. pt goes to Dr. Merlyn Starring at [facility] in [place] at [facility]  Difficulty concentrating or making decisions? 0  Walking or climbing stairs? 0  Dressing or bathing? 0  Doing errands, shopping? 0  Preparing Food and eating ? N  Using the Toilet? N  In the past six months, have you accidently leaked urine? N  Do you have problems with loss of bowel control? N  Managing your Medications? N  Managing your Finances? N  Housekeeping or managing your Housekeeping? N    Patient Care Team: Albertha Huger, [name] as PCP - General (Family Medicine) Hyland Mailman, MD as  Referring Physician (Optometry)  Indicate any recent Medical Services you may have received from other than [facility] providers in the past year (date may be approximate).     Assessment:    This is a routine wellness examination for Anthony Espinoza.  Hearing/Vision screen Hearing Screening - Comments:: Pt denies hearing dif Vision Screening - Comments:: pt wear reading glasses. pt goes to Dr. Merlyn Starring at [facility] in [place] at [facility]   Goals Addressed             This Visit's Progress    DIET - EAT MORE FRUITS AND VEGETABLES   On track      Depression Screen     03/23/2024    8:15 AM 02/20/2024    8:16 AM 12/05/2023    8:11 AM 08/29/2023    9:18 AM 06/03/2023    9:24 AM 06/03/2023    8:44 AM 06/03/2023    8:43 AM  PHQ 2/9 Scores  PHQ - 2 Score 0 0 0 0 0 0 0  PHQ- 9 Score 1 0 0 0 0 0 0    Fall Risk     03/23/2024    8:11 AM 02/20/2024    8:16 AM 12/05/2023    8:11 AM 08/29/2023    9:18 AM 06/03/2023    8:44 AM  Fall Risk   Falls in the past year? 0 0 0 0 0  Number falls in past yr: 0    0  Injury with Fall? 0    0  Risk for fall due to : No Fall Risks    No Fall Risks  Follow up Falls prevention discussed;Falls evaluation completed    Falls evaluation completed    MEDICARE RISK AT HOME:  Medicare Risk at Home Any stairs in or around the home?: No If so, are there any without handrails?: No Home free of loose throw rugs in walkways, pet beds, electrical cords, etc?: Yes Adequate lighting in your home to reduce risk of falls?: Yes Life alert?: No Use of a cane, walker or w/c?: No Grab bars in the bathroom?: No Shower chair or  bench in shower?: No Elevated toilet seat or a handicapped toilet?: No  TIMED UP AND GO:  Was the test performed?  no  Cognitive Function: 6CIT completed        03/23/2024    8:16 AM 03/20/2023    8:23 AM 03/14/2022    8:30 AM 03/03/2021   10:04 AM 03/02/2020   10:31 AM  6CIT Screen  What Year? 0 points 0 points 0 points 0 points 0 points   What month? 0 points 0 points 0 points 0 points 0 points  What time? 0 points 0 points 0 points 0 points 0 points  Count back from 20 0 points 0 points 0 points 4 points 0 points  Months in reverse 0 points 0 points 4 points 0 points 0 points  Repeat phrase 0 points 0 points 0 points 0 points 4 points  Total Score 0 points 0 points 4 points 4 points 4 points    Immunizations Immunization History  Administered Date(s) Administered   Fluad Quad(high Dose 65+) 08/15/2016, 09/06/2017, 09/18/2018, 08/25/2020   Influenza, High Dose Seasonal PF 09/30/2015, 08/15/2016, 09/06/2017, 09/18/2018   Influenza, Quadrivalent, Recombinant, Inj, Pf 08/25/2019   Influenza, Seasonal, Injecte, Preservative Fre 09/30/2015   Influenza,trivalent, recombinat, inj, PF 08/26/2014   Influenza-Unspecified 08/26/2014, 09/30/2015, 08/15/2016, 09/06/2017, 09/18/2018, 08/31/2021   Moderna SARS-COV2 Booster Vaccination 02/13/2021   Moderna Sars-Covid-2 Vaccination 02/17/2020, 03/15/2020, 09/22/2020, 02/13/2021   Pneumococcal Conjugate-13 06/14/2016, 08/25/2019   Pneumococcal Polysaccharide-23 09/06/2017   Tdap 03/05/1999, 01/26/2021   Zoster Recombinant(Shingrix ) 03/19/2022, 05/19/2022    Screening Tests Health Maintenance  Topic Date Due   OPHTHALMOLOGY EXAM  02/14/2024   COVID-19 Vaccine (6 - 2024-25 season) 09/13/2024 (Originally 07/14/2023)   Fecal DNA (Cologuard)  02/19/2025 (Originally 02/08/2024)   Diabetic kidney evaluation - Urine ACR  06/02/2024   FOOT EXAM  06/02/2024   INFLUENZA VACCINE  06/12/2024   HEMOGLOBIN A1C  08/21/2024   Diabetic kidney evaluation - eGFR measurement  12/04/2024   Medicare Annual Wellness (AWV)  03/23/2025   DTaP/Tdap/Td (3 - Td or Tdap) 01/27/2031   Pneumonia Vaccine 44+ Years old  Completed   Hepatitis C Screening  Completed   Zoster Vaccines- Shingrix   Completed   HPV VACCINES  Aged Out   Meningococcal B Vaccine  Aged Out   Colonoscopy  Discontinued    Health  Maintenance  Health Maintenance Due  Topic Date Due   OPHTHALMOLOGY EXAM  02/14/2024   Health Maintenance Items Addressed: See Nurse Notes  Additional Screening:  Vision Screening: Recommended annual ophthalmology exams for early detection of glaucoma and other disorders of the eye.  Dental Screening: Recommended annual dental exams for proper oral hygiene  Community Resource Referral / Chronic Care Management: CRR required this visit?  No   CCM required this visit?  No   Plan:    I have personally reviewed and noted the following in the patient's chart:   Medical and social history Use of alcohol, tobacco or illicit drugs  Current medications and supplements including opioid prescriptions. Patient is not currently taking opioid prescriptions. Functional ability and status Nutritional status Physical activity Advanced directives List of other physicians Hospitalizations, surgeries, and ER visits in previous 12 months Vitals Screenings to include cognitive, depression, and falls Referrals and appointments  In addition, I have reviewed and discussed with patient certain preventive protocols, quality metrics, and best practice recommendations. A written personalized care plan for preventive services as well as general preventive health recommendations were provided to patient.  Michaelle Adolphus, CMA   03/23/2024   After Visit Summary: (Declined) Due to this being a telephonic visit, with patients personalized plan was offered to patient but patient Declined AVS at this time   Notes: Pt stated that he already had his diabetic eye exam done.

## 2024-04-15 ENCOUNTER — Ambulatory Visit: Payer: Self-pay

## 2024-04-15 NOTE — Telephone Encounter (Signed)
 FYI Only or Action Required?: FYI only for provider  Patient was last seen in primary care on 02/20/2024. Called Nurse Triage reporting Diarrhea and Abdominal Pain. Symptoms began several days ago. Interventions attempted: OTC medications: OTC diarrhea medication and Rest, hydration, or home remedies. Symptoms are: gradually improving.  Triage Disposition: See Physician Within 24 Hours  Patient/caregiver understands and will follow disposition?: yes          diarrhea, nausea   Copied From CRM [id number]. Reason for Triage:diarrhea, nausea    Reason for Disposition  [1] MODERATE pain (e.g., interferes with normal activities) AND [2] pain comes and goes (cramps) AND [3] present > 24 hours  (Exception: Pain with Vomiting or Diarrhea - see that Guideline.)  Answer Assessment - Initial Assessment Questions Pt declined same-day appt with another provider, stating he prefers to only see his PCP. RN called CAL, CAL stated PCP is not available today. CAL stated that his PCP is the DOD tomorrow and advised RN to schedule pt tomorrow AM. RN advised pt ED for worsening.    1. LOCATION: "Where does it hurt?"     "All over my whole stomach" 2. RADIATION: "Does the pain shoot anywhere else?" (e.g., chest, back)     Denies  3. ONSET: "When did the pain begin?" (Minutes, hours or days ago)      Since the weekend 4. SUDDEN: "Gradual or sudden onset?"     "Well, my granddaughter had a virus and she had diarrhea, I think I got it from her" 5. PATTERN "Does the pain come and go, or is it constant?"    - If it comes and goes: "How long does it last?" "Do you have pain now?"     (Note: Comes and goes means the pain is intermittent. It goes away completely between bouts.)    - If constant: "Is it getting better, staying the same, or getting worse?"      (Note: Constant means the pain never goes away completely; most serious pain is constant and gets worse.)      Comes and goes, but did hurt all  night last night  6. SEVERITY: "How bad is the pain?"  (e.g., Scale 1-10; mild, moderate, or severe)    - MILD (1-3): Doesn't interfere with normal activities, abdomen soft and not tender to touch.     - MODERATE (4-7): Interferes with normal activities or awakens from sleep, abdomen tender to touch.     - SEVERE (8-10): Excruciating pain, doubled over, unable to do any normal activities.       5/10 7. RECURRENT SYMPTOM: "Have you ever had this type of stomach pain before?" If Yes, ask: "When was the last time?" and "What happened that time?"      Yes - "one point I got an infection in my stomach lining" 8. CAUSE: "What do you think is causing the stomach pain?"     Illness from granddaughter  9. RELIEVING/AGGRAVATING FACTORS: "What makes it better or worse?" (e.g., antacids, bending or twisting motion, bowel movement)     "Actually it feels better after I have diarrhea" 10. OTHER SYMPTOMS: "Do you have any other symptoms?" (e.g., back pain, diarrhea, fever, urination pain, vomiting)       Diarrhea, abdominal pain, chills; denies fever. Denies vomiting. Still has an appetite, eating and drinking normally. Denies urinary symptoms  Diarrhea is watery, yellow. Pt states he took "OTC stuff" and it let up. No diarrhea today  Protocols used: Abdominal Pain -  Male-A-AH

## 2024-04-15 NOTE — Telephone Encounter (Signed)
 E2C2 scheduled appointment.

## 2024-04-16 ENCOUNTER — Ambulatory Visit (INDEPENDENT_AMBULATORY_CARE_PROVIDER_SITE_OTHER): Admitting: Family Medicine

## 2024-04-16 ENCOUNTER — Encounter: Payer: Self-pay | Admitting: Family Medicine

## 2024-04-16 VITALS — BP 124/69 | HR 95 | Temp 98.1°F | Ht 73.0 in | Wt 246.0 lb

## 2024-04-16 DIAGNOSIS — M5136 Other intervertebral disc degeneration, lumbar region with discogenic back pain only: Secondary | ICD-10-CM | POA: Diagnosis not present

## 2024-04-16 DIAGNOSIS — E1169 Type 2 diabetes mellitus with other specified complication: Secondary | ICD-10-CM | POA: Diagnosis not present

## 2024-04-16 DIAGNOSIS — R197 Diarrhea, unspecified: Secondary | ICD-10-CM

## 2024-04-16 DIAGNOSIS — E785 Hyperlipidemia, unspecified: Secondary | ICD-10-CM

## 2024-04-16 MED ORDER — AZITHROMYCIN 500 MG PO TABS: 500.0000 mg | ORAL_TABLET | Freq: Every day | ORAL | 0 refills | Status: AC

## 2024-04-16 MED ORDER — OXYCODONE-ACETAMINOPHEN 5-325 MG PO TABS: 1.0000 | ORAL_TABLET | Freq: Four times a day (QID) | ORAL | 0 refills | Status: DC | PRN

## 2024-04-16 MED ORDER — ATORVASTATIN CALCIUM 20 MG PO TABS: 20.0000 mg | ORAL_TABLET | Freq: Every day | ORAL | 0 refills | Status: DC

## 2024-04-16 NOTE — Progress Notes (Signed)
 Acute Office Visit  Subjective:     Patient ID: Anthony Espinoza, male    DOB: 07/02/1951, 73 y.o.   MRN: [id number]  Chief Complaint  Patient presents with   Diarrhea    Diarrhea  This is a new problem. Episode onset: 5 days. The problem occurs 5 to 10 times per day. The problem has been gradually improving. The stool consistency is described as Watery. Associated symptoms include abdominal pain (lower abdominal cramping), bloating, chills and sweats. Pertinent negatives include no coughing, fever, headaches or vomiting. Risk factors include ill contacts (granddaughter with GI symptoms last week). He has tried anti-motility drug for the symptoms. The treatment provided mild relief.  He is staying well hydrated. Denies dizziness or lightheadedness.   He also needs a refill of his statin.   Has chronic back pain. Has lumbar DDD. He has had 3 prior surgeries. He takes flexeril  and tylenol  prn. Reports that pain is typically manageable. He reports an increase in lower back pain recently due to yardwork that he has been doing this spring/summer. He has been prescribed oxycodone  in the past for short periods with good relief. He uses this very sparingly. He denies numbness, tingling, saddle anesthesia, changes in bowel or bladder control.   Review of Systems  Constitutional:  Positive for chills. Negative for fever.  Respiratory:  Negative for cough.   Gastrointestinal:  Positive for abdominal pain (lower abdominal cramping), bloating and diarrhea. Negative for vomiting.  Neurological:  Negative for headaches.        Objective:    BP 124/69   Pulse 95   Temp 98.1 F (36.7 C) (Temporal)   Ht 6\' 1"  (1.854 m)   Wt 246 lb (111.6 kg)   SpO2 96%   BMI 32.46 kg/m    Physical Exam Vitals and nursing note reviewed.  Constitutional:      General: He is not in acute distress.    Appearance: He is not ill-appearing, toxic-appearing or diaphoretic.  Eyes:     General: No scleral  icterus. Cardiovascular:     Rate and Rhythm: Normal rate and regular rhythm.     Pulses: Normal pulses.     Heart sounds: Normal heart sounds. No murmur heard. Pulmonary:     Effort: Pulmonary effort is normal. No respiratory distress.     Breath sounds: Normal breath sounds.  Abdominal:     General: Bowel sounds are normal. There is distension (mild).     Palpations: Abdomen is soft. There is no mass.     Tenderness: There is no abdominal tenderness. There is no guarding or rebound.  Musculoskeletal:     Cervical back: Neck supple. No tenderness.     Right lower leg: No edema.     Left lower leg: No edema.  Lymphadenopathy:     Cervical: No cervical adenopathy.  Skin:    General: Skin is warm and dry.     Coloration: Skin is not jaundiced.  Neurological:     General: No focal deficit present.     Mental Status: He is alert and oriented to person, place, and time.  Psychiatric:        Mood and Affect: Mood normal.        Behavior: Behavior normal.     No results found for any visits on 04/16/24.      Assessment & Plan:   Olly was seen today for diarrhea.  Diagnoses and all orders for this visit:  Acute diarrhea After  exposure to ill contact. Will treat empirically with abx given age and number of episodes of diarrhea. Non-toxic appearing, no concerns for acute abdomen. Discussed hydration, symptomatic care.  -     azithromycin  (ZITHROMAX ) 500 MG tablet; Take 1 tablet (500 mg total) by mouth daily for 3 days.  Hyperlipidemia associated with type 2 diabetes mellitus (HCC) Continue statin.  -     atorvastatin  (LIPITOR) 20 MG tablet; Take 1 tablet (20 mg total) by mouth daily. for cholesterol.  Degeneration of intervertebral disc of lumbar region with discogenic back pain PDMP reviewed, no red flags. No red flag symptoms. Oxycodone  as below. Use sparingly.  -     oxyCODONE -acetaminophen  (PERCOCET/ROXICET) 5-325 MG tablet; Take 1 tablet by mouth every 6 (six) hours as  needed for severe pain (pain score 7-10).    Return if symptoms worsen or fail to improve.  The patient indicates understanding of these issues and agrees with the plan.  Albertha Huger, [name]

## 2024-04-22 ENCOUNTER — Other Ambulatory Visit: Payer: Self-pay | Admitting: Family Medicine

## 2024-04-22 DIAGNOSIS — M62838 Other muscle spasm: Secondary | ICD-10-CM

## 2024-04-29 ENCOUNTER — Telehealth: Payer: Self-pay | Admitting: Family Medicine

## 2024-04-29 NOTE — Telephone Encounter (Signed)
Left message to schedule diabetic eye exam 

## 2024-04-29 NOTE — Telephone Encounter (Signed)
 Patient returned call stated he does not want to schedule the diabetic eye exam. He has his exams done at [facility].

## 2024-04-30 ENCOUNTER — Other Ambulatory Visit: Payer: Self-pay | Admitting: Family Medicine

## 2024-04-30 DIAGNOSIS — N400 Enlarged prostate without lower urinary tract symptoms: Secondary | ICD-10-CM

## 2024-04-30 LAB — OPHTHALMOLOGY REPORT-SCANNED

## 2024-05-01 ENCOUNTER — Other Ambulatory Visit: Payer: Self-pay | Admitting: Family Medicine

## 2024-05-01 DIAGNOSIS — E1165 Type 2 diabetes mellitus with hyperglycemia: Secondary | ICD-10-CM

## 2024-05-03 ENCOUNTER — Other Ambulatory Visit: Payer: Self-pay | Admitting: Family Medicine

## 2024-05-03 DIAGNOSIS — E1165 Type 2 diabetes mellitus with hyperglycemia: Secondary | ICD-10-CM

## 2024-05-20 ENCOUNTER — Other Ambulatory Visit: Payer: Self-pay | Admitting: Family Medicine

## 2024-05-20 DIAGNOSIS — I152 Hypertension secondary to endocrine disorders: Secondary | ICD-10-CM

## 2024-05-20 DIAGNOSIS — R000 Tachycardia, unspecified: Secondary | ICD-10-CM

## 2024-05-28 ENCOUNTER — Other Ambulatory Visit: Payer: Self-pay | Admitting: Family Medicine

## 2024-05-28 DIAGNOSIS — E1165 Type 2 diabetes mellitus with hyperglycemia: Secondary | ICD-10-CM

## 2024-06-03 ENCOUNTER — Ambulatory Visit (INDEPENDENT_AMBULATORY_CARE_PROVIDER_SITE_OTHER): Payer: Medicare HMO | Admitting: Family Medicine

## 2024-06-03 ENCOUNTER — Encounter: Payer: Self-pay | Admitting: Family Medicine

## 2024-06-03 VITALS — BP 123/76 | HR 88 | Temp 98.3°F | Ht 73.0 in | Wt 245.8 lb

## 2024-06-03 DIAGNOSIS — R1084 Generalized abdominal pain: Secondary | ICD-10-CM

## 2024-06-03 DIAGNOSIS — E785 Hyperlipidemia, unspecified: Secondary | ICD-10-CM | POA: Diagnosis not present

## 2024-06-03 DIAGNOSIS — R10817 Generalized abdominal tenderness: Secondary | ICD-10-CM | POA: Diagnosis not present

## 2024-06-03 DIAGNOSIS — M5136 Other intervertebral disc degeneration, lumbar region with discogenic back pain only: Secondary | ICD-10-CM | POA: Diagnosis not present

## 2024-06-03 DIAGNOSIS — E1159 Type 2 diabetes mellitus with other circulatory complications: Secondary | ICD-10-CM

## 2024-06-03 DIAGNOSIS — I152 Hypertension secondary to endocrine disorders: Secondary | ICD-10-CM | POA: Diagnosis not present

## 2024-06-03 DIAGNOSIS — R000 Tachycardia, unspecified: Secondary | ICD-10-CM | POA: Diagnosis not present

## 2024-06-03 DIAGNOSIS — E1165 Type 2 diabetes mellitus with hyperglycemia: Secondary | ICD-10-CM | POA: Insufficient documentation

## 2024-06-03 DIAGNOSIS — R0982 Postnasal drip: Secondary | ICD-10-CM

## 2024-06-03 DIAGNOSIS — R140 Abdominal distension (gaseous): Secondary | ICD-10-CM

## 2024-06-03 DIAGNOSIS — N400 Enlarged prostate without lower urinary tract symptoms: Secondary | ICD-10-CM | POA: Diagnosis not present

## 2024-06-03 DIAGNOSIS — E1169 Type 2 diabetes mellitus with other specified complication: Secondary | ICD-10-CM | POA: Diagnosis not present

## 2024-06-03 LAB — LIPID PANEL

## 2024-06-03 LAB — BAYER DCA HB A1C WAIVED: HB A1C (BAYER DCA - WAIVED): 8.7 % — ABNORMAL HIGH (ref 4.8–5.6)

## 2024-06-03 MED ORDER — GLIPIZIDE ER 10 MG PO TB24: 10.0000 mg | ORAL_TABLET | Freq: Every day | ORAL | 3 refills | Status: AC

## 2024-06-03 MED ORDER — LEVOCETIRIZINE DIHYDROCHLORIDE 5 MG PO TABS: 5.0000 mg | ORAL_TABLET | Freq: Every evening | ORAL | 3 refills | Status: AC

## 2024-06-03 MED ORDER — METFORMIN HCL ER 500 MG PO TB24: 1000.0000 mg | ORAL_TABLET | Freq: Two times a day (BID) | ORAL | 3 refills | Status: DC

## 2024-06-03 MED ORDER — METOPROLOL SUCCINATE ER 50 MG PO TB24: 50.0000 mg | ORAL_TABLET | Freq: Every day | ORAL | 3 refills | Status: AC

## 2024-06-03 MED ORDER — LISINOPRIL 2.5 MG PO TABS
2.5000 mg | ORAL_TABLET | Freq: Every day | ORAL | 3 refills | Status: AC
Start: 2024-06-03 — End: ?

## 2024-06-03 MED ORDER — ATORVASTATIN CALCIUM 20 MG PO TABS
20.0000 mg | ORAL_TABLET | Freq: Every day | ORAL | 3 refills | Status: AC
Start: 2024-06-03 — End: ?

## 2024-06-03 MED ORDER — TAMSULOSIN HCL 0.4 MG PO CAPS
0.4000 mg | ORAL_CAPSULE | Freq: Every day | ORAL | 3 refills | Status: AC
Start: 2024-06-03 — End: ?

## 2024-06-03 MED ORDER — AMLODIPINE BESYLATE 10 MG PO TABS: 10.0000 mg | ORAL_TABLET | Freq: Every day | ORAL | 3 refills | Status: AC

## 2024-06-03 MED ORDER — METOPROLOL SUCCINATE ER 25 MG PO TB24: ORAL_TABLET | ORAL | 3 refills | Status: AC

## 2024-06-03 MED ORDER — OXYCODONE-ACETAMINOPHEN 5-325 MG PO TABS: 1.0000 | ORAL_TABLET | Freq: Four times a day (QID) | ORAL | 0 refills | Status: DC | PRN

## 2024-06-03 NOTE — Addendum Note (Signed)
 Addended by: JOESPH [name] HERO on: 06/03/2024 03:23 PM   Modules accepted: Level of Service

## 2024-06-03 NOTE — Addendum Note (Signed)
 Addended by: JOESPH [name] HERO on: 06/03/2024 10:02 AM   Modules accepted: Orders

## 2024-06-03 NOTE — Progress Notes (Addendum)
 Established Patient Office Visit  Subjective   Patient ID: Anthony Espinoza, male    DOB: 1951/11/07  Age: 73 y.o. MRN: [id number]  Chief Complaint  Patient presents with   Medical Management of Chronic Issues    HPI DM Pt presents for follow up evaluation of Type 2 diabetes mellitus.  Current symptoms include none.   Current diabetic medications: glipizide  10 mg, metformin  500 mg BID Compliant with meds - Yes  Unable to afford jardiance  or januvia in the past.   Current monitoring regimen: none  Eye exam current (within one year): no  Current diet: low carb  Current exercise: gardening and yard work   2. HTN Complaint with meds - Yes Current Medications - amlodipine  10 mg, lisinopril  2.5, metoprolol  75 mg Checking BP at home - no Pertinent ROS:  Headache - No Fatigue - No Visual Disturbances - No Chest pain - No Dyspnea - No Palpitations - No LE edema - No  He has declined EKG and further evaluation of tachycardia numerous times in the past.   3. Back pain Increase back pain with increased activity with yard work this summer. He is requesting a refill on oxycodone  to have on hand. He has used this very sparingly in the past. Also takes tylenol , NSAIDs, muscle relaxers, heat prn. Hx of lumbar surgery.   4. Stomach pain Recently having abdominal pain in the center with bloating, distention at times. Abdomen feels tight across the center. Pain is generalized and dull. Symptoms wax and wane. Denies heartburn, changes in bowel habits, urinary symptoms, dysphagia, changes in appetite, nausea, vomiting. Bloating occurs without relation to eating. No diet changes. Sometimes has cramping. No weight loss.    Past Medical History:  Diagnosis Date   Basal cell carcinoma (BCC) of back 01/30/2022   Dr. Eda   Diabetes mellitus without complication (HCC)    Hyperlipidemia    Hypertension       ROS As per HPI.    Objective:     BP 123/76   Pulse 88   Temp 98.3  F (36.8 C) (Temporal)   Ht 6' 1 (1.854 m)   Wt 245 lb 12.8 oz (111.5 kg)   SpO2 96%   BMI 32.43 kg/m    Wt Readings from Last 3 Encounters:  06/03/24 245 lb 12.8 oz (111.5 kg)  04/16/24 246 lb (111.6 kg)  03/23/24 234 lb (106.1 kg)     Physical Exam Vitals and nursing note reviewed.  Constitutional:      General: He is not in acute distress.    Appearance: He is not ill-appearing, toxic-appearing or diaphoretic.  HENT:     Right Ear: Tympanic membrane, ear canal and external ear normal.     Left Ear: Tympanic membrane, ear canal and external ear normal.  Neck:     Thyroid: No thyroid mass, thyromegaly or thyroid tenderness.  Cardiovascular:     Rate and Rhythm: Normal rate and regular rhythm.     Heart sounds: Normal heart sounds. No murmur heard. Pulmonary:     Effort: Pulmonary effort is normal.     Breath sounds: Normal breath sounds.  Abdominal:     General: Abdomen is protuberant. Bowel sounds are normal. There is distension.     Palpations: Abdomen is soft.     Tenderness: There is generalized abdominal tenderness. There is no guarding or rebound. Negative signs include Murphy's sign and McBurney's sign.     Hernia: No hernia is present.  Musculoskeletal:  Cervical back: Neck supple. No rigidity.     Right lower leg: No edema.     Left lower leg: No edema.  Skin:    General: Skin is warm and dry.  Neurological:     General: No focal deficit present.     Mental Status: He is alert and oriented to person, place, and time.  Psychiatric:        Mood and Affect: Mood normal.        Behavior: Behavior normal.      No results found for any visits on 06/03/24.    The ASCVD Risk score (Arnett DK, et al., 2019) failed to calculate for the following reasons:   The valid total cholesterol range is 130 to 320 mg/dL    Assessment & Plan:   Magic was seen today for medical management of chronic issues.  Diagnoses and all orders for this visit:  Type 2  diabetes mellitus with hyperglycemia, without long-term current use of insulin (HCC) A1c not at goal. Increase metformin  to 1000 mg BID. Continue ACE and statin.  -     Vitamin B12 -     Microalbumin / creatinine urine ratio -     Bayer DCA Hb A1c Waived -     glipiZIDE  (GLUCOTROL  XL) 10 MG 24 hr tablet; Take 1 tablet (10 mg total) by mouth daily. -     metFORMIN  (GLUCOPHAGE -XR) 500 MG 24 hr tablet; Take 2 tablets (1,000 mg total) by mouth 2 (two) times daily with a meal. TAKE 1 TABLET BY MOUTH IN THE MORNING AND 1 AT BEDTIME  Hypertension associated with diabetes (HCC) BP at goal.  -     CBC with Differential/Platelet -     CMP14+EGFR -     amLODipine  (NORVASC ) 10 MG tablet; Take 1 tablet (10 mg total) by mouth daily. -     lisinopril  (ZESTRIL ) 2.5 MG tablet; Take 1 tablet (2.5 mg total) by mouth daily.  Hyperlipidemia associated with type 2 diabetes mellitus (HCC) On statin. Fasting panel pending.  -     Lipid panel -     atorvastatin  (LIPITOR) 20 MG tablet; Take 1 tablet (20 mg total) by mouth daily. for cholesterol.  Tachycardia RRR today.  -     metoprolol  succinate (TOPROL -XL) 25 MG 24 hr tablet; TAKE 1 TABLET BY MOUTH ONCE DAILY TAKE  WITH  50MG   ER  TABLET -     metoprolol  succinate (TOPROL -XL) 50 MG 24 hr tablet; Take 1 tablet (50 mg total) by mouth daily.  Benign prostatic hyperplasia without lower urinary tract symptoms Well controlled on current regimen.  -     tamsulosin  (FLOMAX ) 0.4 MG CAPS capsule; Take 1 capsule (0.4 mg total) by mouth daily.  Postnasal drip Continue xyzal .  -     levocetirizine (XYZAL ) 5 MG tablet; Take 1 tablet (5 mg total) by mouth every evening.  Generalized abdominal pain Generalized abdominal tenderness without rebound tenderness Abdominal distension Unsure of etiology. ? Acute abdomen. STAT CT ordered for further evaluation. CMP and CBC pending.  -     CT ABDOMEN PELVIS W CONTRAST; Future  Degeneration of intervertebral disc of lumbar  region with discogenic back pain PDMP reviewed, no red flags. Will need to discuss CSA and UDS if he continues to need refills.  -     oxyCODONE -acetaminophen  (PERCOCET/ROXICET) 5-325 MG tablet; Take 1 tablet by mouth every 6 (six) hours as needed for severe pain (pain score 7-10).   Return in about  3 months (around 09/03/2024), or if symptoms worsen or fail to improve, for chronic follow up.   The patient indicates understanding of these issues and agrees with the plan.  [name] CHRISTELLA Search, [name]

## 2024-06-04 ENCOUNTER — Ambulatory Visit: Payer: Self-pay | Admitting: Family Medicine

## 2024-06-04 LAB — CBC WITH DIFFERENTIAL/PLATELET
Basophils Absolute: 0.1 x10E3/uL (ref 0.0–0.2)
Basos: 1 %
EOS (ABSOLUTE): 0.6 x10E3/uL — ABNORMAL HIGH (ref 0.0–0.4)
Eos: 8 %
Hematocrit: 48.8 % (ref 37.5–51.0)
Hemoglobin: 16.1 g/dL (ref 13.0–17.7)
Immature Grans (Abs): 0 x10E3/uL (ref 0.0–0.1)
Immature Granulocytes: 0 %
Lymphocytes Absolute: 2.6 x10E3/uL (ref 0.7–3.1)
Lymphs: 35 %
MCH: 31 pg (ref 26.6–33.0)
MCHC: 33 g/dL (ref 31.5–35.7)
MCV: 94 fL (ref 79–97)
Monocytes Absolute: 0.7 x10E3/uL (ref 0.1–0.9)
Monocytes: 10 %
Neutrophils Absolute: 3.5 x10E3/uL (ref 1.4–7.0)
Neutrophils: 46 %
Platelets: 286 x10E3/uL (ref 150–450)
RBC: 5.2 x10E6/uL (ref 4.14–5.80)
RDW: 12.5 % (ref 11.6–15.4)
WBC: 7.4 x10E3/uL (ref 3.4–10.8)

## 2024-06-04 LAB — LIPID PANEL
Cholesterol, Total: 172 mg/dL (ref 100–199)
HDL: 44 mg/dL (ref >39)
LDL CALC COMMENT:: 3.9 ratio (ref 0.0–5.0)
LDL Chol Calc (NIH): 99 mg/dL (ref 0–99)
Triglycerides: 166 mg/dL — AB (ref 0–149)
VLDL Cholesterol Cal: 29 mg/dL (ref 5–40)

## 2024-06-04 LAB — CMP14+EGFR
ALT: 25 IU/L (ref 0–44)
AST: 22 IU/L (ref 0–40)
Albumin: 4.6 g/dL (ref 3.8–4.8)
Alkaline Phosphatase: 66 IU/L (ref 44–121)
BUN/Creatinine Ratio: 26 — AB (ref 10–24)
BUN: 24 mg/dL (ref 8–27)
Bilirubin Total: 0.4 mg/dL (ref 0.0–1.2)
CO2: 19 mmol/L — AB (ref 20–29)
Calcium: 9.8 mg/dL (ref 8.6–10.2)
Chloride: 102 mmol/L (ref 96–106)
Creatinine, Ser: 0.91 mg/dL (ref 0.76–1.27)
Globulin, Total: 3 g/dL (ref 1.5–4.5)
Glucose: 191 mg/dL — AB (ref 70–99)
Potassium: 4.7 mmol/L (ref 3.5–5.2)
Sodium: 139 mmol/L (ref 134–144)
Total Protein: 7.6 g/dL (ref 6.0–8.5)
eGFR: 89 mL/min/1.73 (ref >59)

## 2024-06-04 LAB — VITAMIN B12: Vitamin B-12: 739 pg/mL (ref 232–1245)

## 2024-06-04 LAB — MICROALBUMIN / CREATININE URINE RATIO
Creatinine, Urine: 49.3 mg/dL
Microalb/Creat Ratio: 11 mg/g{creat} (ref 0–29)
Microalbumin, Urine: 5.6 ug/mL

## 2024-06-08 ENCOUNTER — Ambulatory Visit (HOSPITAL_COMMUNITY)
Admission: RE | Admit: 2024-06-08 | Discharge: 2024-06-08 | Disposition: A | Discharge status: Home / Self Care | Source: Ambulatory Visit | Attending: Family Medicine | Admitting: Family Medicine

## 2024-06-08 ENCOUNTER — Encounter (HOSPITAL_COMMUNITY): Payer: Self-pay | Admitting: Radiology

## 2024-06-08 DIAGNOSIS — N281 Cyst of kidney, acquired: Secondary | ICD-10-CM | POA: Diagnosis not present

## 2024-06-08 DIAGNOSIS — N400 Enlarged prostate without lower urinary tract symptoms: Secondary | ICD-10-CM | POA: Diagnosis not present

## 2024-06-08 DIAGNOSIS — K5730 Diverticulosis of large intestine without perforation or abscess without bleeding: Secondary | ICD-10-CM | POA: Diagnosis not present

## 2024-06-08 DIAGNOSIS — R10817 Generalized abdominal tenderness: Secondary | ICD-10-CM | POA: Insufficient documentation

## 2024-06-08 DIAGNOSIS — R1084 Generalized abdominal pain: Secondary | ICD-10-CM | POA: Diagnosis not present

## 2024-06-08 DIAGNOSIS — K7689 Other specified diseases of liver: Secondary | ICD-10-CM | POA: Diagnosis not present

## 2024-06-08 DIAGNOSIS — R140 Abdominal distension (gaseous): Secondary | ICD-10-CM | POA: Insufficient documentation

## 2024-06-08 MED ORDER — IOHEXOL 300 MG/ML  SOLN
100.0000 mL | Freq: Once | INTRAMUSCULAR | Status: AC | PRN
  Administered 2024-06-08: 100 mL via INTRAVENOUS

## 2024-06-09 ENCOUNTER — Ambulatory Visit: Payer: Self-pay

## 2024-06-09 NOTE — Telephone Encounter (Signed)
 FYI Only or Action Required?: Action required by provider: lab or test result follow-up needed. Would like to know results of CT, states still having pain and swelling.   Patient was last seen in primary care on 06/03/2024 by Joesph [name] HERO, [name].  Called Nurse Triage reporting Abdominal Pain.  Symptoms began several months ago.  Interventions attempted: Nothing.  Symptoms are: unchanged.  Triage Disposition: See PCP Within 2 Weeks  Copied from CRM #[id number]. Topic: Clinical - Red Word Triage >> Jun 09, 2024  8:42 AM Anthony Espinoza wrote: Red Word that prompted transfer to Nurse Triage: Patient had a CT of his abdomin done yesterday. Is awaiting the results but states he is still having stomach issues and his stomach is still swollen. Reason for Disposition  Abdominal pain is a chronic symptom (recurrent or ongoing AND present > 4 weeks)  Answer Assessment - Initial Assessment Questions 1. LOCATION: Where does it hurt?      middle 2. RADIATION: Does the pain shoot anywhere else? (e.g., chest, back)     denies 3. ONSET: When did the pain begin? (Minutes, hours or days ago)      Couple months 4. SUDDEN: Gradual or sudden onset?     gradual 5. PATTERN Does the pain come and go, or is it constant?     Dull constant 6. SEVERITY: How bad is the pain?  (e.g., Scale 1-10; mild, moderate, or severe)     5 7. RECURRENT SYMPTOM: Have you ever had this type of stomach pain before? If Yes, ask: When was the last time? and What happened that time?      Feels the same as when told he had infection in the lining of stomach 10. OTHER SYMPTOMS: Do you have any other symptoms? (e.g., back pain, diarrhea, fever, urination pain, vomiting)       Denies fever, mild diarrhea,   Pt states that the pain and swelling is the same as when he saw PCP last.  Protocols used: Abdominal Pain - Male-A-AH

## 2024-06-11 ENCOUNTER — Telehealth: Payer: Self-pay | Admitting: *Deleted

## 2024-06-11 NOTE — Telephone Encounter (Signed)
 Pt called back and Tiffany wanted me to ask if he has had any tick bites. Pt states he hasn't had any tick bites and now he is having diarrhea which he also had back in June. Pt has bloating, abdominal pain and diarrhea at times. Tiffany will send in Bentyl for trial for IBS if pt is willing and pt is ok with this and will let us  know if this is helpful as pt declines GI at this time. Bentyl sent in.

## 2024-06-11 NOTE — Telephone Encounter (Signed)
 Tried to contact pt regarding some follow up questions the provider has from yesterday's CT results. If pt calls back please get myself or Tiffany.

## 2024-06-22 ENCOUNTER — Other Ambulatory Visit: Payer: Self-pay | Admitting: Family Medicine

## 2024-06-22 DIAGNOSIS — M62838 Other muscle spasm: Secondary | ICD-10-CM

## 2024-07-07 ENCOUNTER — Other Ambulatory Visit: Payer: Self-pay | Admitting: Family Medicine

## 2024-07-07 DIAGNOSIS — M51369 Other intervertebral disc degeneration, lumbar region without mention of lumbar back pain or lower extremity pain: Secondary | ICD-10-CM

## 2024-07-07 DIAGNOSIS — E1165 Type 2 diabetes mellitus with hyperglycemia: Secondary | ICD-10-CM

## 2024-07-09 ENCOUNTER — Telehealth: Payer: Self-pay | Admitting: Family Medicine

## 2024-07-09 DIAGNOSIS — E1165 Type 2 diabetes mellitus with hyperglycemia: Secondary | ICD-10-CM

## 2024-07-09 NOTE — Telephone Encounter (Unsigned)
 Copied from CRM #[id number]. Topic: Clinical - Medication Refill >> Jul 09, 2024  1:32 PM Rachelle R wrote: Medication: metFORMIN  (GLUCOPHAGE -XR) 500 MG 24 hr tablet  **patient states dosage was changed from 2 pills a day to 4 a day and has run out.  Has the patient contacted their pharmacy? Yes, call dr  This is the patient's preferred pharmacy:  [id number] - [place], [place] - [address] [place] [address] Phone: [phone] Fax: [phone]  Is this the correct pharmacy for this prescription? Yes If no, delete pharmacy and type the correct one.   Has the prescription been filled recently? No  Is the patient out of the medication? Yes  Has the patient been seen for an appointment in the last year OR does the patient have an upcoming appointment? Yes  Can we respond through MyChart? Yes  Agent: Please be advised that Rx refills may take up to 3 business days. We ask that you follow-up with your pharmacy.

## 2024-07-27 ENCOUNTER — Ambulatory Visit: Payer: Self-pay

## 2024-07-27 DIAGNOSIS — R140 Abdominal distension (gaseous): Secondary | ICD-10-CM

## 2024-07-27 DIAGNOSIS — R109 Unspecified abdominal pain: Secondary | ICD-10-CM

## 2024-07-27 DIAGNOSIS — K760 Fatty (change of) liver, not elsewhere classified: Secondary | ICD-10-CM

## 2024-07-27 NOTE — Telephone Encounter (Signed)
 FYI Only or Action Required?: Action required by provider: clinical question for provider and update on patient condition.  Patient was last seen in primary care on 06/03/2024 by Anthony [name] HERO, [name].  Called Nurse Triage reporting Abdominal Pain.  Symptoms began several months ago.  Interventions attempted: Rest, hydration, or home remedies.  Symptoms are: unchanged.  Triage Disposition: Call PCP Now, See HCP Within 4 Hours (Or PCP Triage)  Patient/caregiver understands and will follow disposition?: No, wishes to speak with PCP       Copied from CRM #[id number]. Topic: Clinical - Red Word Triage >> Jul 27, 2024  8:03 AM Carlatta H wrote: Red Word that prompted transfer to Nurse Triage: Patient was seen for stomach issues in July and not issues are getting worse//Patient is now in more pain//Patient also stated he should have been prescribed medication back in July but the prescription was never sent// Reason for Disposition  [1] MILD-MODERATE pain AND [2] constant AND [3] age > 60 years  [1] Caller has URGENT medicine question about med that primary care doctor (or NP/PA) or specialist prescribed AND [2] triager unable to answer question  Answer Assessment - Initial Assessment Questions 1. LOCATION: Where does it hurt?      Whole stomach 2. RADIATION: Does the pain shoot anywhere else? (e.g., chest, back)     denies 3. ONSET: When did the pain begin? (Minutes, hours or days ago)      months 4. SUDDEN: Gradual or sudden onset?     *No Answer* 5. PATTERN Does the pain come and go, or is it constant?     constant 6. SEVERITY: How bad is the pain?  (e.g., Scale 1-10; mild, moderate, or severe)     *No Answer* 7. RECURRENT SYMPTOM: Have you ever had this type of stomach pain before? If Yes, ask: When was the last time? and What happened that time?      Yes, endorses infection in stomach 8. CAUSE: What do you think is causing the stomach pain? (e.g.,  gallstones, recent abdominal surgery)     unknown 9. RELIEVING/AGGRAVATING FACTORS: What makes it better or worse? (e.g., antacids, bending or twisting motion, bowel movement)     unknown 10. OTHER SYMPTOMS: Do you have any other symptoms? (e.g., back pain, diarrhea, fever, urination pain, vomiting)       Swelling in my stomach    Pt reports was seen by PCP in July and was supposed to get medication (abx) for abd pain-- pt reports that PCP was planning on treating for IBS, but never got prescription. Additionally, pt reports that Metformin  dose was adjusted, but SIG was not as discussed.  Triager offered to schedule with alternate provider, pt declines and only wishes to see PCP. Triager will forward encounter for [name] 's office to review and see if pt can be accommodated/Rx can be sent in. Patient verbalized understanding and is expecting call back from office for availabilities.  Answer Assessment - Initial Assessment Questions 1. NAME of MEDICINE: What medicine(s) are you calling about?     metFORMIN  (GLUCOPHAGE -XR) 500 MG 24 hr tablet 2. QUESTION: What is your question? (e.g., double dose of medicine, side effect)     Dosage question - reports last OV increased dosage to 2 pills in AM and PM, but Rx SIG is 1 pill in AM and PN 3. PRESCRIBER: Who prescribed the medicine? Reason: if prescribed by specialist, call should be referred to that group.     PCP 4. SYMPTOMS:  Do you have any symptoms? If Yes, ask: What symptoms are you having?  How bad are the symptoms (e.g., mild, moderate, severe)     N/a 5. PREGNANCY:  Is there any chance that you are pregnant? When was your last menstrual period?     N/a  Protocols used: Abdominal Pain - Male-A-AH, Medication Question Call-A-AH

## 2024-07-27 NOTE — Telephone Encounter (Signed)
 Copied from CRM [id number]. Topic: General - Other >> Jul 27, 2024  4:10 PM Yolanda T wrote: Reason for CRM: patient returning CAL call.

## 2024-07-27 NOTE — Telephone Encounter (Signed)
 I spoke to pt and advised the CT back in July didn't show anything acute that warranted antibiotics and referral to GI was recommended but pt had declined at the time. Pt would now like a referral placed so referral placed and pt is aware.

## 2024-08-13 DIAGNOSIS — R140 Abdominal distension (gaseous): Secondary | ICD-10-CM | POA: Diagnosis not present

## 2024-08-13 DIAGNOSIS — Z8619 Personal history of other infectious and parasitic diseases: Secondary | ICD-10-CM | POA: Diagnosis not present

## 2024-08-13 DIAGNOSIS — K760 Fatty (change of) liver, not elsewhere classified: Secondary | ICD-10-CM | POA: Diagnosis not present

## 2024-08-19 DIAGNOSIS — Z85828 Personal history of other malignant neoplasm of skin: Secondary | ICD-10-CM | POA: Diagnosis not present

## 2024-08-19 DIAGNOSIS — L570 Actinic keratosis: Secondary | ICD-10-CM | POA: Diagnosis not present

## 2024-08-19 DIAGNOSIS — I781 Nevus, non-neoplastic: Secondary | ICD-10-CM | POA: Diagnosis not present

## 2024-08-19 DIAGNOSIS — L821 Other seborrheic keratosis: Secondary | ICD-10-CM | POA: Diagnosis not present

## 2024-09-03 ENCOUNTER — Encounter: Payer: Self-pay | Admitting: Family Medicine

## 2024-09-03 ENCOUNTER — Ambulatory Visit: Admitting: Family Medicine

## 2024-09-03 VITALS — BP 128/71 | HR 97 | Temp 98.4°F | Ht 73.0 in | Wt 240.6 lb

## 2024-09-03 DIAGNOSIS — R10817 Generalized abdominal tenderness: Secondary | ICD-10-CM

## 2024-09-03 DIAGNOSIS — E1159 Type 2 diabetes mellitus with other circulatory complications: Secondary | ICD-10-CM

## 2024-09-03 DIAGNOSIS — Z7984 Long term (current) use of oral hypoglycemic drugs: Secondary | ICD-10-CM

## 2024-09-03 DIAGNOSIS — E1165 Type 2 diabetes mellitus with hyperglycemia: Secondary | ICD-10-CM | POA: Diagnosis not present

## 2024-09-03 DIAGNOSIS — E1169 Type 2 diabetes mellitus with other specified complication: Secondary | ICD-10-CM | POA: Diagnosis not present

## 2024-09-03 DIAGNOSIS — Z79899 Other long term (current) drug therapy: Secondary | ICD-10-CM

## 2024-09-03 DIAGNOSIS — M51362 Other intervertebral disc degeneration, lumbar region with discogenic back pain and lower extremity pain: Secondary | ICD-10-CM | POA: Diagnosis not present

## 2024-09-03 DIAGNOSIS — E785 Hyperlipidemia, unspecified: Secondary | ICD-10-CM | POA: Diagnosis not present

## 2024-09-03 DIAGNOSIS — I152 Hypertension secondary to endocrine disorders: Secondary | ICD-10-CM

## 2024-09-03 DIAGNOSIS — M62838 Other muscle spasm: Secondary | ICD-10-CM | POA: Diagnosis not present

## 2024-09-03 LAB — BAYER DCA HB A1C WAIVED: HB A1C (BAYER DCA - WAIVED): 12 % — ABNORMAL HIGH (ref 4.8–5.6)

## 2024-09-03 MED ORDER — IBUPROFEN 800 MG PO TABS: 800.0000 mg | ORAL_TABLET | Freq: Three times a day (TID) | ORAL | 0 refills | Status: AC | PRN

## 2024-09-03 MED ORDER — BLOOD GLUCOSE MONITORING SUPPL DEVI
1.0000 | 0 refills | Status: AC
Start: 2024-09-03 — End: ?

## 2024-09-03 MED ORDER — OXYCODONE-ACETAMINOPHEN 5-325 MG PO TABS: 1.0000 | ORAL_TABLET | Freq: Four times a day (QID) | ORAL | 0 refills | Status: AC | PRN

## 2024-09-03 MED ORDER — METFORMIN HCL ER 500 MG PO TB24: 1000.0000 mg | ORAL_TABLET | Freq: Two times a day (BID) | ORAL | 3 refills | Status: AC

## 2024-09-03 MED ORDER — TIZANIDINE HCL 4 MG PO TABS: ORAL_TABLET | ORAL | 3 refills | Status: AC

## 2024-09-03 MED ORDER — BLOOD GLUCOSE TEST VI STRP
1.0000 | ORAL_STRIP | 0 refills | Status: AC
Start: 2024-09-03 — End: ?

## 2024-09-03 MED ORDER — LANCETS MISC
1.0000 | 0 refills | Status: AC
Start: 2024-09-03 — End: ?

## 2024-09-03 MED ORDER — LANCET DEVICE MISC
1.0000 | 0 refills | Status: AC
Start: 2024-09-03 — End: ?

## 2024-09-03 MED ORDER — OXYCODONE-ACETAMINOPHEN 5-325 MG PO TABS
1.0000 | ORAL_TABLET | Freq: Four times a day (QID) | ORAL | 0 refills | Status: AC | PRN
Start: 2024-10-04 — End: 2024-11-03

## 2024-09-03 NOTE — Progress Notes (Signed)
 Established Patient Office Visit  Subjective   Patient ID: Anthony Espinoza, male    DOB: [birth year]  Age: 73 y.o. MRN: [id number]  Chief Complaint  Patient presents with   Medical Management of Chronic Issues    HPI   History of Present Illness   Anthony Espinoza is a 73 year old male with type 2 diabetes who presents for management of elevated A1c levels.  Hyperglycemia and diabetes management - Type 2 diabetes mellitus with elevated hemoglobin A1c of 12% - No regular blood glucose monitoring; does not have a glucometer - Current medication regimen includes metformin , previously prescribed at conflicting doses of 500 mg and 1000 mg twice daily - No use of other diabetes medications due to cost concerns - No changes in diet; avoids bread and sugary drinks  Gastrointestinal symptoms - History of gastrointestinal issues; previously tested for small bowel bacterial overgrowth, results not received - Bowel movements two to three times daily - Experiencing less pain and bloating than previously     Chronic back pain - Chronic back pain rated 6/10 on typical days, increasing to 8-9/10 on bad days - Pain occasionally radiates down the legs - Exacerbated by physical activity, such as yard work - History of six prior back surgeries - Uses pain medication sparingly, not daily - Utilizes lidocaine patches and occasional stretching for pain management   [place]  Controlled Substance Abuse database reviewed- Yes If yes- were their any concerning findings : no  [employer] drug screen performed- Yes  SOAPP  0= never  1= seldom  2=sometimes  3= often  4= very often  How often do you have mood swings? 0 How often do you smoke a cigarette within an hour after waling up? 0 How often have you taken medication other than the way that it was prescribed?0 How often have you used illegal drugs in the past 5 years? 0 How often, in your lifetime, have you had legal problems or been  arrested? 0  Score 0  Alcohol Audit - How often during the last year have found that you: 0-Never   1- Less than monthly   2- Monthly     3-Weekly     4-daily or almost daily  - found that you were not able to stop drinking once you started- 0 -failed to do what was normally expected of you because of drinking- 0 -needed a first drink in the morning- 0 -had a feeling of guilt or remorse after drinking- 0 -are/were unable to remember what happened the night before because of your drinking- 0  0- NO   2- yes but not in last year  4- yes during last year -Have you or someone else been injured because of your drinking- 0 - Has anyone been concerned about your drinking or suggested you cut down- 0        TOTAL- 0  ( 0-7- alcohol education, 8-15- simple advice, 16-19 simple advice plus counseling, 20-40 referral for evaluation and treatment )  Designated Pharmacy- [facility]  Pain assessment: Cause of pain- DDD, lumbar radiculopathy Pain location- lower back, bilateral legs Pain on scale of 1-10- 6-9/10 Frequency- daily What increases pain- activities, housework, yardwork What makes pain Better- topical patches, stretching, pain medicine Effects on ADL - limits somewhat, continues to work through the pain as much as possible  Prior treatments tried and failed- NSAIDs, tylenol , conservative treatments, lumbar surgery x6 Current opioids rx- norco 5 TID prn # prescribed- 15  Pain  management agreement reviewed and signed- Yes  Past Medical History:  Diagnosis Date   Basal cell carcinoma (BCC) of back 01/30/2022   Dr. Eda   Diabetes mellitus without complication (HCC)    Hyperlipidemia    Hypertension       ROS Negative unless specially indicated above in HPI.   Objective:     BP 128/71   Pulse 97   Temp 98.4 F (36.9 C) (Temporal)   Ht 6' 1 (1.854 m)   Wt 240 lb 9.6 oz (109.1 kg)   SpO2 95%   BMI 31.74 kg/m    Physical Exam Vitals and nursing note reviewed.   Constitutional:      General: He is not in acute distress.    Appearance: Normal appearance. He is not ill-appearing.  Cardiovascular:     Rate and Rhythm: Normal rate and regular rhythm.     Pulses: Normal pulses.     Heart sounds: Normal heart sounds. No murmur heard. Pulmonary:     Effort: Pulmonary effort is normal. No respiratory distress.     Breath sounds: Normal breath sounds.  Abdominal:     General: Bowel sounds are normal. There is no distension.     Palpations: Abdomen is soft. There is no mass.     Tenderness: There is no abdominal tenderness. There is no guarding or rebound.  Musculoskeletal:     Cervical back: Neck supple. No tenderness.     Right lower leg: No edema.     Left lower leg: No edema.  Lymphadenopathy:     Cervical: No cervical adenopathy.  Skin:    General: Skin is warm and dry.  Neurological:     General: No focal deficit present.     Mental Status: He is alert and oriented to person, place, and time.  Psychiatric:        Mood and Affect: Mood normal.        Behavior: Behavior normal.      No results found for any visits on 09/03/24.    The 10-year ASCVD risk score (Arnett DK, et al., 2019) is: 42.5%    Assessment & Plan:   Kadarius was seen today for medical management of chronic issues.  Diagnoses and all orders for this visit:  Type 2 diabetes mellitus with hyperglycemia, without long-term current use of insulin (HCC) -     Bayer DCA Hb A1c Waived -     metFORMIN  (GLUCOPHAGE -XR) 500 MG 24 hr tablet; Take 2 tablets (1,000 mg total) by mouth 2 (two) times daily with a meal. -     Blood Glucose Monitoring Suppl DEVI; 1 each by Does not apply route as directed. Dispense based on patient and insurance preference. Use up to four times daily as directed. (FOR ICD-10 E10.9, E11.9). -     Glucose Blood (BLOOD GLUCOSE TEST STRIPS) STRP; 1 each by Does not apply route as directed. Dispense based on patient and insurance preference. Use up to four  times daily as directed. (FOR ICD-10 E10.9, E11.9). -     Lancet Device MISC; 1 each by Does not apply route as directed. Dispense based on patient and insurance preference. Use up to four times daily as directed. (FOR ICD-10 E10.9, E11.9). -     Lancets MISC; 1 each by Does not apply route as directed. Dispense based on patient and insurance preference. Use up to four times daily as directed. (FOR ICD-10 E10.9, E11.9).  Hypertension associated with diabetes (HCC)  Hyperlipidemia associated  with type 2 diabetes mellitus (HCC)  Muscle spasm -     tiZANidine  (ZANAFLEX ) 4 MG tablet; TAKE 1 TABLET BY MOUTH EVERY 6 HOURS AS NEEDED FOR MUSCLE SPASM  Degeneration of intervertebral disc of lumbar region with discogenic back pain and lower extremity pain -     ibuprofen  (ADVIL ) 800 MG tablet; Take 1 tablet (800 mg total) by mouth every 8 (eight) hours as needed. -     ToxASSURE Select 13 (MW), Urine -     oxyCODONE -acetaminophen  (PERCOCET/ROXICET) 5-325 MG tablet; Take 1 tablet by mouth every 6 (six) hours as needed for severe pain (pain score 7-10). -     oxyCODONE -acetaminophen  (PERCOCET/ROXICET) 5-325 MG tablet; Take 1 tablet by mouth every 6 (six) hours as needed for severe pain (pain score 7-10). -     oxyCODONE -acetaminophen  (PERCOCET/ROXICET) 5-325 MG tablet; Take 1 tablet by mouth every 6 (six) hours as needed for severe pain (pain score 7-10).  Controlled substance agreement signed -     ToxASSURE Select 13 (MW), Urine  Generalized abdominal tenderness without rebound tenderness   Assessment and Plan    Type 2 diabetes mellitus with poor glycemic control A1c at 12 indicates poor control. GLP-1 receptor agonists discussed but may worsen gastrointestinal issues. - Adjust metformin  to 1000 mg twice daily. - Provide glucometer for home monitoring. - Instruct to check fasting and postprandial blood glucose. - Facilitate MyChart access for communication. - Reassess blood glucose in a few  weeks. - Consider cost-effective medication options if needed.  HTN BP at goal.   HLD On statin.  Muscle spasm Continue zanaflex  prn.   Chronic low back pain with lumbar disc degeneration Pain exacerbated by physical activity, daily intensity 6/10, increasing to 8-9/10. History of six surgeries, sparing use of pain medication. - Sign controlled substance agreement. PDMP reviewed, no red flags.  - Prescribe 15 pills of pain medication, additional refills x2 available.  - Conduct annual urine drug screen. - Advise lidocaine or icy hot patches for pain relief.       Return in about 3 months (around 12/04/2024) for chronic follow up.   The patient indicates understanding of these issues and agrees with the plan.  Anthony CHRISTELLA Search, [name]

## 2024-09-09 ENCOUNTER — Ambulatory Visit: Payer: Self-pay | Admitting: Family Medicine

## 2024-09-09 LAB — TOXASSURE SELECT 13 (MW), URINE

## 2024-09-14 ENCOUNTER — Ambulatory Visit (INDEPENDENT_AMBULATORY_CARE_PROVIDER_SITE_OTHER): Admitting: Family Medicine

## 2024-09-14 ENCOUNTER — Ambulatory Visit: Payer: Self-pay

## 2024-09-14 ENCOUNTER — Encounter: Payer: Self-pay | Admitting: Family Medicine

## 2024-09-14 VITALS — BP 128/68 | HR 119 | Temp 101.2°F | Ht 73.0 in | Wt 242.0 lb

## 2024-09-14 DIAGNOSIS — J069 Acute upper respiratory infection, unspecified: Secondary | ICD-10-CM | POA: Diagnosis not present

## 2024-09-14 DIAGNOSIS — R509 Fever, unspecified: Secondary | ICD-10-CM

## 2024-09-14 LAB — VERITOR SARS-COV-2 AND FLU A+B
BD Veritor SARS-CoV-2 Ag: NEGATIVE
Influenza A: NEGATIVE
Influenza B: NEGATIVE

## 2024-09-14 MED ORDER — BENZONATATE 100 MG PO CAPS: 100.0000 mg | ORAL_CAPSULE | Freq: Two times a day (BID) | ORAL | 0 refills | Status: DC | PRN

## 2024-09-14 NOTE — Telephone Encounter (Signed)
 FYI Only or Action Required?: FYI only for provider: appointment scheduled on This afternoon.  Patient was last seen in primary care on 09/03/2024 by Anthony [name] HERO, [name].  Called Nurse Triage reporting Cough chills, sore throat.  Symptoms began several days ago.  Interventions attempted: OTC medications:  SABRA  Symptoms are: unchanged.  Triage Disposition: See Physician Within 24 Hours  Patient/caregiver understands and will follow disposition?: Yes                      Summary: Cough, sore throat, seeking assistance   Reason for Triage: Cough, sore throat, feels weak, seeking medical assistance  Best contact: [phone]       Reason for Disposition  [1] Continuous (nonstop) coughing interferes with work or school AND [2] no improvement using cough treatment per Care Advice  Answer Assessment - Initial Assessment Questions 1. ONSET: When did the cough begin?      Last week 2. SEVERITY: How bad is the cough today?      moderate 3. SPUTUM: Describe the color of your sputum (e.g., none, dry cough; clear, white, yellow, green)     Yellow - green 4. HEMOPTYSIS: Are you coughing up any blood? If Yes, ask: How much? (e.g., flecks, streaks, tablespoons, etc.)     no 5. DIFFICULTY BREATHING: Are you having difficulty breathing? If Yes, ask: How bad is it? (e.g., mild, moderate, severe)      no 6. FEVER: Do you have a fever? If Yes, ask: What is your temperature, how was it measured, and when did it start?     Chills and sweats 7. CARDIAC HISTORY: Do you have any history of heart disease? (e.g., heart attack, congestive heart failure)      no 8. LUNG HISTORY: Do you have any history of lung disease?  (e.g., pulmonary embolus, asthma, emphysema)     no 10. OTHER SYMPTOMS: Do you have any other symptoms? (e.g., runny nose, wheezing, chest pain)       Sore throat, cough, weak  Protocols used: Cough - Acute Productive-A-AH

## 2024-09-14 NOTE — Telephone Encounter (Signed)
 Appt made.

## 2024-09-14 NOTE — Progress Notes (Signed)
 Acute Office Visit  Subjective:     Patient ID: Anthony Espinoza, male    DOB: [birth year], 73 y.o.   MRN: [id number]  Chief Complaint  Patient presents with   Sore Throat   Fever    HPI  History of Present Illness   Anthony Espinoza is a 73 year old male who presents with fever, sore throat, and cough.  Fever and constitutional symptoms - Fever for the past two days, with temperature reaching 101.30F - Chills since Sunday morning - Body aches present - Unable to attend church due to severe coughing and feeling unwell - No monitoring of temperature at home - Tylenol  used to manage fever and chills, last dose at 2 PM today  Upper respiratory symptoms - Sore throat for the past two days - Cough productive of green and yellow phlegm - Persistent cough causing sore chest - Occasional ear pain  Gastrointestinal symptoms - Loose stools present - No nausea, vomiting, or watery diarrhea  Exposure history - Close contact with granddaughter, age almost two years, who has runny nose and cough and is being evaluated by a physician  Response to over-the-counter medications - Continued symptoms despite use of Tylenol , Mucinex, and cough medicine       ROS As per HPI.      Objective:    BP 128/68   Pulse (!) 119   Temp (!) 101.2 F (38.4 C) (Temporal)   Ht 6' 1 (1.854 m)   Wt 242 lb (109.8 kg)   SpO2 93%   BMI 31.93 kg/m    Physical Exam Vitals and nursing note reviewed.  Constitutional:      General: He is not in acute distress.    Appearance: He is ill-appearing. He is not toxic-appearing or diaphoretic.  HENT:     Right Ear: Tympanic membrane, ear canal and external ear normal.     Left Ear: Tympanic membrane, ear canal and external ear normal.     Nose: Congestion present.     Mouth/Throat:     Mouth: Mucous membranes are moist.     Pharynx: Posterior oropharyngeal erythema present. No pharyngeal swelling, oropharyngeal exudate or uvula swelling.      Tonsils: No tonsillar exudate or tonsillar abscesses. 1+ on the right. 1+ on the left.  Eyes:     General:        Right eye: No discharge.        Left eye: No discharge.     Conjunctiva/sclera: Conjunctivae normal.  Cardiovascular:     Rate and Rhythm: Regular rhythm.     Heart sounds: Normal heart sounds. No murmur heard. Pulmonary:     Effort: Pulmonary effort is normal. No respiratory distress.     Breath sounds: Normal breath sounds. No wheezing, rhonchi or rales.  Musculoskeletal:     Right lower leg: No edema.     Left lower leg: No edema.  Skin:    General: Skin is warm and dry.  Neurological:     General: No focal deficit present.     Mental Status: He is alert and oriented to person, place, and time.  Psychiatric:        Mood and Affect: Mood normal.        Behavior: Behavior normal.     No results found for any visits on 09/14/24.      Assessment & Plan:  Elvyn was seen today for sore throat and fever.  Diagnoses and all orders for this  visit:  Fever, unspecified fever cause -     Veritor SARS-CoV-2 and Flu A+B -     Discontinue: benzonatate  (TESSALON ) 100 MG capsule; Take 1 capsule (100 mg total) by mouth 2 (two) times daily as needed for cough. -     benzonatate  (TESSALON ) 100 MG capsule; Take 1 capsule (100 mg total) by mouth 2 (two) times daily as needed for cough.  Viral URI   Assessment and Plan    Acute viral upper respiratory infection with fever Negative rapid covid and flu. Discussed viral etiology. Expect symptoms for 7-10 days. Discussed symptomatic care.  - Alternate Tylenol  and ibuprofen  every three hours for fever. - Prescribed Tessalon  Perles for cough. - Encouraged hydration. - Advised symptomatic care.      Return to office for new or worsening symptoms, or if symptoms persist.   [name] CHRISTELLA Search, [name]

## 2024-09-18 DIAGNOSIS — R0602 Shortness of breath: Secondary | ICD-10-CM | POA: Diagnosis not present

## 2024-09-18 DIAGNOSIS — R070 Pain in throat: Secondary | ICD-10-CM | POA: Diagnosis not present

## 2024-09-18 DIAGNOSIS — R918 Other nonspecific abnormal finding of lung field: Secondary | ICD-10-CM | POA: Diagnosis not present

## 2024-09-18 DIAGNOSIS — J189 Pneumonia, unspecified organism: Secondary | ICD-10-CM | POA: Diagnosis not present

## 2024-09-18 DIAGNOSIS — E119 Type 2 diabetes mellitus without complications: Secondary | ICD-10-CM | POA: Diagnosis not present

## 2024-09-18 DIAGNOSIS — R5383 Other fatigue: Secondary | ICD-10-CM | POA: Diagnosis not present

## 2024-09-18 DIAGNOSIS — R509 Fever, unspecified: Secondary | ICD-10-CM | POA: Diagnosis not present

## 2024-09-18 DIAGNOSIS — R0989 Other specified symptoms and signs involving the circulatory and respiratory systems: Secondary | ICD-10-CM | POA: Diagnosis not present

## 2024-09-18 DIAGNOSIS — R059 Cough, unspecified: Secondary | ICD-10-CM | POA: Diagnosis not present

## 2024-09-28 DIAGNOSIS — J189 Pneumonia, unspecified organism: Secondary | ICD-10-CM | POA: Diagnosis not present

## 2024-10-12 ENCOUNTER — Encounter: Payer: Self-pay | Admitting: Family Medicine

## 2024-10-12 ENCOUNTER — Ambulatory Visit: Admitting: Family Medicine

## 2024-10-12 VITALS — BP 125/74 | HR 96 | Temp 98.6°F | Ht 73.0 in | Wt 237.0 lb

## 2024-10-12 DIAGNOSIS — J189 Pneumonia, unspecified organism: Secondary | ICD-10-CM

## 2024-10-12 DIAGNOSIS — R052 Subacute cough: Secondary | ICD-10-CM | POA: Diagnosis not present

## 2024-10-12 DIAGNOSIS — E785 Hyperlipidemia, unspecified: Secondary | ICD-10-CM

## 2024-10-12 DIAGNOSIS — E1169 Type 2 diabetes mellitus with other specified complication: Secondary | ICD-10-CM | POA: Diagnosis not present

## 2024-10-12 MED ORDER — PREDNISONE 20 MG PO TABS: 40.0000 mg | ORAL_TABLET | Freq: Every day | ORAL | 0 refills | Status: AC

## 2024-10-12 MED ORDER — GUAIFENESIN-CODEINE 100-10 MG/5ML PO SOLN: 5.0000 mL | Freq: Three times a day (TID) | ORAL | 0 refills | Status: DC | PRN

## 2024-10-12 NOTE — Progress Notes (Signed)
 Acute Office Visit  Subjective:     Patient ID: Anthony Espinoza, male    DOB: 07/20/1951, 73 y.o.   MRN: [id number]  Chief Complaint  Patient presents with   Cough    HPI  History of Present Illness   HUSAM HOHN is a 73 year old male who presents with a persistent cough following a recent pneumonia diagnosis.  He has a persistent cough following a recent diagnosis of pneumonia in the left lower lung, identified via x-ray at an urgent care visit. Initially, he presented with a fever on November 3rd, and subsequent testing for COVID-19 and other conditions was negative. He was treated with Levaquin starting November 7th for pneumonia by UC, which improved his condition, but he continued to experience rib pain and was later given an antibiotic shot with Rocephin and prescribed doxycycline  on 11/17 by UC.  He describes a current 'hacking cough' with difficulty expectorating phlegm, despite using Mucinex and other over-the-counter remedies. He notes a sensation of phlegm buildup and a tickle in his throat, particularly worsening at night, which affects his sleep. No shortness of breath, fever, wheezing, or chest tightness. Coughing comes in fits.   He mentions a significant impact on his physical strength during the illness, stating he was unable to walk from his house to the driveway and felt extremely weak when traveling for the x-ray. He is now regaining strength and reports eating and drinking well.  He is currently taking oxycodone  for pain management. He uses this rarely and has not taken any of the 15 tablets from the last fill. He recalls using cough medicine with codeine in the past, with good relief. He has failed OTC cough medication and tessalon  perles with this illness.   He plans to travel to [place] and [place] with his family later this week.       ROS As per HPI.      Objective:    BP 125/74   Pulse 96   Temp 98.6 F (37 C) (Temporal)   Ht 6' 1  (1.854 m)   Wt 237 lb (107.5 kg)   SpO2 98%   BMI 31.27 kg/m    Physical Exam Vitals and nursing note reviewed.  Constitutional:      General: He is not in acute distress.    Appearance: He is not ill-appearing, toxic-appearing or diaphoretic.  HENT:     Right Ear: Tympanic membrane, ear canal and external ear normal.     Left Ear: Ear canal and external ear normal.     Nose: Nose normal.     Mouth/Throat:     Mouth: Mucous membranes are moist.     Pharynx: Oropharynx is clear. No oropharyngeal exudate or posterior oropharyngeal erythema.  Eyes:     General:        Right eye: No discharge.        Left eye: No discharge.     Conjunctiva/sclera: Conjunctivae normal.  Cardiovascular:     Rate and Rhythm: Normal rate and regular rhythm.     Heart sounds: Normal heart sounds. No murmur heard. Pulmonary:     Effort: Pulmonary effort is normal. No respiratory distress.     Breath sounds: No wheezing, rhonchi or rales.  Chest:     Chest wall: No tenderness.  Musculoskeletal:     Cervical back: Neck supple. No rigidity.     Right lower leg: No edema.     Left lower leg: No edema.  Skin:    General: Skin is warm and dry.  Neurological:     General: No focal deficit present.     Mental Status: He is alert and oriented to person, place, and time.  Psychiatric:        Mood and Affect: Mood normal.        Behavior: Behavior normal.     No results found for any visits on 10/12/24.      Assessment & Plan:   Davier was seen today for cough.  Diagnoses and all orders for this visit:  Pneumonia of left lower lobe due to infectious organism  Subacute cough -     predniSONE  (DELTASONE ) 20 MG tablet; Take 2 tablets (40 mg total) by mouth daily with breakfast for 5 days. -     guaiFENesin-codeine 100-10 MG/5ML syrup; Take 5 mLs by mouth 3 (three) times daily as needed for cough.  Hyperlipidemia associated with type 2 diabetes mellitus (HCC)   Assessment and Plan     Subacute cough following recent left lower lobe pneumonia Subacute cough persists post-pneumonia. No fever, dyspnea, or wheezing. Dry cough with coughing fits. No relief from Mucinex or Tessalon  Perles. Prednisone  considered for inflammation. Reviewed UC notes, imaging from 09/18/24, 09/28/24. - Lungs clear today - Prescribed prednisone , two tablets once daily for five days with breakfast. - Cough syrup as below. Do not take with prescription pain medication. - Instructed to monitor for wheezing, shortness of breath, or chest pain and report if these occur.     HLD assoc with T2DM Continue statin.    Return to office for new or worsening symptoms, or if symptoms persist.   The patient indicates understanding of these issues and agrees with the plan.  [name] CHRISTELLA Search, [name]

## 2024-11-03 ENCOUNTER — Ambulatory Visit: Admitting: Gastroenterology

## 2024-11-30 NOTE — Progress Notes (Addendum)
 Anthony Espinoza                                          MRN: [id number]   11/30/2024   The [facility] Quality Team Specialist reviewed this patient medical record for the purposes of chart review for care gap closure. The following were reviewed: chart review for care gap closure-glycemic status assessment. RECHECKED FOR UPDATED [facility]S FOR 2025.    [facility] Quality Team

## 2024-12-10 ENCOUNTER — Encounter: Payer: Self-pay | Admitting: Family Medicine

## 2024-12-10 ENCOUNTER — Ambulatory Visit (INDEPENDENT_AMBULATORY_CARE_PROVIDER_SITE_OTHER): Payer: Self-pay | Admitting: Family Medicine

## 2024-12-10 VITALS — BP 136/81 | HR 100 | Temp 98.2°F | Ht 73.0 in | Wt 236.2 lb

## 2024-12-10 DIAGNOSIS — Z79899 Other long term (current) drug therapy: Secondary | ICD-10-CM

## 2024-12-10 DIAGNOSIS — Z7984 Long term (current) use of oral hypoglycemic drugs: Secondary | ICD-10-CM

## 2024-12-10 DIAGNOSIS — E1159 Type 2 diabetes mellitus with other circulatory complications: Secondary | ICD-10-CM

## 2024-12-10 DIAGNOSIS — J0140 Acute pansinusitis, unspecified: Secondary | ICD-10-CM

## 2024-12-10 DIAGNOSIS — E1169 Type 2 diabetes mellitus with other specified complication: Secondary | ICD-10-CM

## 2024-12-10 DIAGNOSIS — E785 Hyperlipidemia, unspecified: Secondary | ICD-10-CM

## 2024-12-10 DIAGNOSIS — I152 Hypertension secondary to endocrine disorders: Secondary | ICD-10-CM | POA: Diagnosis not present

## 2024-12-10 DIAGNOSIS — M51362 Other intervertebral disc degeneration, lumbar region with discogenic back pain and lower extremity pain: Secondary | ICD-10-CM | POA: Diagnosis not present

## 2024-12-10 DIAGNOSIS — E1165 Type 2 diabetes mellitus with hyperglycemia: Secondary | ICD-10-CM | POA: Diagnosis not present

## 2024-12-10 LAB — BAYER DCA HB A1C WAIVED: HB A1C (BAYER DCA - WAIVED): 10.6 % — ABNORMAL HIGH (ref 4.8–5.6)

## 2024-12-10 MED ORDER — EMPAGLIFLOZIN 10 MG PO TABS: 10.0000 mg | ORAL_TABLET | Freq: Every day | ORAL | 3 refills | Status: AC

## 2024-12-10 MED ORDER — OXYCODONE-ACETAMINOPHEN 5-325 MG PO TABS: 1.0000 | ORAL_TABLET | ORAL | 0 refills | Status: AC | PRN

## 2024-12-10 MED ORDER — AMOXICILLIN-POT CLAVULANATE 875-125 MG PO TABS: 1.0000 | ORAL_TABLET | Freq: Two times a day (BID) | ORAL | 0 refills | Status: AC
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
## 2024-12-15 ENCOUNTER — Other Ambulatory Visit: Payer: Self-pay | Admitting: Family Medicine

## 2024-12-15 DIAGNOSIS — R052 Subacute cough: Secondary | ICD-10-CM

## 2024-12-16 NOTE — Progress Notes (Unsigned)
 "  GI Office Note    Referring Provider: Joesph [name] HERO, [name] Primary Care Physician:  Joesph [name] HERO, [name]  Primary Gastroenterologist: Lamar HERO.Rourk, MD  Chief Complaint   Chief Complaint  Patient presents with   Bloated    Lots of bloating    History of Present Illness   Anthony Espinoza is a 74 y.o. male presenting today at the request of Joesph [name] HERO, [name] for evaluation of abdominal pain, bloating, and fatty liver.  Colonoscopy 2001 - none since then.   Visit with primary care office 12/10/2024 reporting abdominal pain and bloating stating he feels like his stomach is so big and uncomfortable.  Also noting chronic back pain given a skiing accident years prior and multiple back surgeries.  Labs July 2025 with normal LFTs and with mildly elevated triglycerides of 166.  CT abdomen pelvis with contrast July 2025: - Geographic fatty infiltration of the liver - Colonic diverticulosis without acute diverticulitis - Prostatomegaly  A1c 10.6 on 12/10/2024.  Last Cologuard - March 2022   Today:  Discussed the use of AI scribe software for clinical note transcription with the patient, who gave verbal consent to proceed.  Persistent abdominal bloating and distention have been present for an extended period, with episodes of significant gas and a sensation of tightness. Abdominal distention is sometimes severe enough to make fastening pants difficult and is described as feeling hard as a rock. Symptoms fluctuate, often worsening later in the day and occasionally absent. Distention causes self-consciousness, particularly in public.  Multiple over-the-counter remedies for gas, including products similar to Gas-X, have been tried without relief. No over-the-counter medication has been effective. He previously took probiotics and fiber supplements but recently stopped fiber pills within the last 1-2 years. Dairy products, including 2% milk, are consumed. Dietary habits are  careful due to diabetes.  Occasional sour or bad taste in the throat occurs in the mornings, along with mild stomach upset upon waking. No strong nausea is present. No current use of medication for heartburn or indigestion. Anti-reflux surgery was performed years ago, with no typical reflux symptoms since although not able to find record of this. No reports of where it was performed.   A longstanding hernia is present but not bothersome and has not caused pain or changes in bowel habits. Bowel movements are normal, typically every morning and sometimes two to three times daily. No diarrhea, constipation, blood in stool, or melena. Appetite is good and eating is normal.  Over a year ago, he was treated for an infection in the stomach lining after a breath test, though follow-up was delayed. No recent antibiotics for gastrointestinal issues, but a 7-day course of antibiotics for a sinus infection was completed yesterday.  Colonoscopy in 2001 (review of report reveals a mildly enlarged prostate which was smooth without nodules, sigmoid diverticulosis, and sigmoid polyp s/p removal.  No significant internal hemorrhoids.  Had Cologuard test three years ago (2022) were negative. No family history of colon cancer.  He states he will not perform another colonoscopy given he is at high risk for not having any symptoms therefore does not want to continue screening.     Fibrosis 4 Score = 1.12 Score is based on outdated labs. ALT, AST, and platelets should all be measured within the last 6 months for an accurate FIB-4 Score  Fib-4 interpretation is not validated for people under 35 or over 37 years of age. However, scores under 2.0 are generally considered low risk.  Wt Readings from Last 6 Encounters:  12/17/24 238 lb 3.2 oz (108 kg)  12/10/24 236 lb 3.2 oz (107.1 kg)  10/12/24 237 lb (107.5 kg)  09/14/24 242 lb (109.8 kg)  09/03/24 240 lb 9.6 oz (109.1 kg)  06/03/24 245 lb 12.8 oz (111.5 kg)     Body mass index is 31.43 kg/m.  Current Outpatient Medications  Medication Sig Dispense Refill   amLODipine  (NORVASC ) 10 MG tablet Take 1 tablet (10 mg total) by mouth daily. 90 tablet 3   aspirin 81 MG chewable tablet Chew 81 mg by mouth daily.     atorvastatin  (LIPITOR) 20 MG tablet Take 1 tablet (20 mg total) by mouth daily. for cholesterol. 90 tablet 3   Blood Glucose Monitoring Suppl DEVI 1 each by Does not apply route as directed. Dispense based on patient and insurance preference. Use up to four times daily as directed. (FOR ICD-10 E10.9, E11.9). 1 each 0   empagliflozin  (JARDIANCE ) 10 MG TABS tablet Take 1 tablet (10 mg total) by mouth daily. 90 tablet 3   glipiZIDE  (GLUCOTROL  XL) 10 MG 24 hr tablet Take 1 tablet (10 mg total) by mouth daily. 90 tablet 3   Glucose Blood (BLOOD GLUCOSE TEST STRIPS) STRP 1 each by Does not apply route as directed. Dispense based on patient and insurance preference. Use up to four times daily as directed. (FOR ICD-10 E10.9, E11.9). 100 strip 0   ibuprofen  (ADVIL ) 800 MG tablet Take 1 tablet (800 mg total) by mouth every 8 (eight) hours as needed. 90 tablet 0   Lancet Device MISC 1 each by Does not apply route as directed. Dispense based on patient and insurance preference. Use up to four times daily as directed. (FOR ICD-10 E10.9, E11.9). 1 each 0   Lancets MISC 1 each by Does not apply route as directed. Dispense based on patient and insurance preference. Use up to four times daily as directed. (FOR ICD-10 E10.9, E11.9). 100 each 0   levocetirizine (XYZAL ) 5 MG tablet Take 1 tablet (5 mg total) by mouth every evening. 90 tablet 3   lisinopril  (ZESTRIL ) 2.5 MG tablet Take 1 tablet (2.5 mg total) by mouth daily. 90 tablet 3   metFORMIN  (GLUCOPHAGE -XR) 500 MG 24 hr tablet Take 2 tablets (1,000 mg total) by mouth 2 (two) times daily with a meal. 360 tablet 3   metoprolol  succinate (TOPROL -XL) 25 MG 24 hr tablet TAKE 1 TABLET BY MOUTH ONCE DAILY TAKE  WITH   50MG   ER  TABLET 90 tablet 3   metoprolol  succinate (TOPROL -XL) 50 MG 24 hr tablet Take 1 tablet (50 mg total) by mouth daily. 90 tablet 3   omeprazole  (PRILOSEC) 20 MG capsule Take 1 capsule (20 mg total) by mouth daily. 30 capsule 1   oxyCODONE -acetaminophen  (PERCOCET/ROXICET) 5-325 MG tablet Take 1 tablet by mouth every 4 (four) hours as needed for severe pain (pain score 7-10). 15 tablet 0   tamsulosin  (FLOMAX ) 0.4 MG CAPS capsule Take 1 capsule (0.4 mg total) by mouth daily. 90 capsule 3   tiZANidine  (ZANAFLEX ) 4 MG tablet TAKE 1 TABLET BY MOUTH EVERY 6 HOURS AS NEEDED FOR MUSCLE SPASM 30 tablet 3   amoxicillin -clavulanate (AUGMENTIN ) 875-125 MG tablet Take 1 tablet by mouth 2 (two) times daily for 7 days. (Patient not taking: Reported on 12/17/2024) 14 tablet 0   No current facility-administered medications for this visit.    Past Medical History:  Diagnosis Date   Basal cell carcinoma (BCC) of back  01/30/2022   Dr. Eda   Diabetes mellitus without complication (HCC)    Hyperlipidemia    Hypertension     Past Surgical History:  Procedure Laterality Date   BACK SURGERY     x6   NECK SURGERY     SPINAL CORD STIMULATOR IMPLANT      Family History  Problem Relation Age of Onset   Diabetes Mother    Parkinson's disease Brother     Allergies as of 12/17/2024 - Review Complete 12/17/2024  Allergen Reaction Noted   Sulfa antibiotics Other (See Comments) 06/28/2014   Other  01/26/2021    Social History   Socioeconomic History   Marital status: Married    Spouse name: Not on file   Number of children: Not on file   Years of education: Not on file   Highest education level: 12th grade  Occupational History   Occupation: Retired  Tobacco Use   Smoking status: Former    Current packs/day: 0.00    Types: Cigarettes    Quit date: 10/28/1999    Years since quitting: 25.1   Smokeless tobacco: Never  Vaping Use   Vaping status: Never Used  Substance and Sexual Activity    Alcohol use: Not Currently   Drug use: Never   Sexual activity: Not on file  Other Topics Concern   Not on file  Social History Narrative   Not on file   Social Drivers of Health   Tobacco Use: Medium Risk (12/17/2024)   Patient History    Smoking Tobacco Use: Former    Smokeless Tobacco Use: Never    Passive Exposure: Not on Actuary Strain: Low Risk (12/06/2024)   Overall Financial Resource Strain (CARDIA)    Difficulty of Paying Living Expenses: Not very hard  Food Insecurity: No Food Insecurity (12/06/2024)   Epic    Worried About Radiation Protection Practitioner of Food in the Last Year: Never true    Ran Out of Food in the Last Year: Never true  Transportation Needs: No Transportation Needs (12/06/2024)   Epic    Lack of Transportation (Medical): No    Lack of Transportation (Non-Medical): No  Physical Activity: Sufficiently Active (12/06/2024)   Exercise Vital Sign    Days of Exercise per Week: 4 days    Minutes of Exercise per Session: 60 min  Stress: No Stress Concern Present (12/06/2024)   [employer] of Occupational Health - Occupational Stress Questionnaire    Feeling of Stress: Not at all  Social Connections: Socially Integrated (12/06/2024)   Social Connection and Isolation Panel    Frequency of Communication with Friends and Family: Three times a week    Frequency of Social Gatherings with Friends and Family: Twice a week    Attends Religious Services: More than 4 times per year    Active Member of Clubs or Organizations: Yes    Attends Banker Meetings: More than 4 times per year    Marital Status: Living with partner  Intimate Partner Violence: Not At Risk (03/23/2024)   Humiliation, Afraid, Rape, and Kick questionnaire    Fear of Current or Ex-Partner: No    Emotionally Abused: No    Physically Abused: No    Sexually Abused: No  Depression (PHQ2-9): Low Risk (12/10/2024)   Depression (PHQ2-9)    PHQ-2 Score: 0  Alcohol Screen: Low Risk  (03/23/2024)   Alcohol Screen    Last Alcohol Screening Score (AUDIT): 0  Housing: Low Risk (12/06/2024)  Epic    Unable to Pay for Housing in the Last Year: No    Number of Times Moved in the Last Year: 0    Homeless in the Last Year: No  Utilities: Low Risk (09/18/2024)   Received from [facility]   Utilities    Within the past 12 months, have you been unable to get utilities(heat, electricity) when it was really needed?: No  Health Literacy: Adequate Health Literacy (03/23/2024)   B1300 Health Literacy    Frequency of need for help with medical instructions: Never    Review of Systems   Gen: Denies any fever, chills, fatigue, weight loss, lack of appetite.  CV: Denies chest pain, heart palpitations, peripheral edema, syncope.  Resp: Denies shortness of breath at rest or with exertion. Denies wheezing or cough.  GI: see HPI GU : Denies urinary burning, urinary frequency, urinary hesitancy MS: Denies joint pain, muscle weakness, cramps, or limitation of movement.  Derm: Denies rash, itching, dry skin Psych: Denies depression, anxiety, memory loss, and confusion Heme: Denies bruising, bleeding, and enlarged lymph nodes.  Physical Exam   BP 136/81 (BP Location: Right Arm, Patient Position: Sitting, Cuff Size: Large)   Pulse (!) 101   Temp 98.5 F (36.9 C) (Temporal)   Ht 6' 1 (1.854 m)   Wt 238 lb 3.2 oz (108 kg)   BMI 31.43 kg/m   General:   Alert and oriented. Pleasant and cooperative. Well-nourished and well-developed.  Head:  Normocephalic and atraumatic. Eyes:  Without icterus, sclera clear and conjunctiva pink.  Ears:  Normal auditory acuity. Mouth:  No deformity or lesions, oral mucosa pink.  Missing teeth.  Abdomen:  +BS, soft, non-tender and rounded. Ventral hernia present. No HSM noted. No guarding or rebound. No masses appreciated.  Rectal:  deferred  Msk:  Symmetrical without gross deformities. Normal posture. Extremities:  Without edema. Neurologic:   Alert and  oriented x4;  grossly normal neurologically. Skin:  Intact without significant lesions or rashes. Psych:  Alert and cooperative. Normal mood and affect.  Assessment & Plan   Anthony Espinoza is a 74 y.o. male with a history of HTN, HLD, diabetes, basal cell carcinoma of the back, chronic back pain secondary to skiing accident s/p multiple surgical procedures presenting today with concerns of bloating.     Abdominal bloating and distention Chronic, multifactorial, and non-acute abdominal bloating and distention, likely related to functional bloating, dietary factors, possible bacterial imbalance, and prior anti-reflux surgery, without alarm features or evidence of obstruction or malignancy. - Discussed dietary modifications to reduce gas-producing foods and consideration of lactose intolerance. - Recommended trial of high-dose simethicone (Fayosim) and Fodzyme Guard for symptomatic relief. - Advised resuming fiber supplementation with guidance that initial increase may transiently worsen flatulence. - Recommended maintaining a food diary to identify dietary triggers. - Deferred SIBO breath test (offered to patient); will consider if symptoms persist after initial interventions. - Provided anticipatory guidance regarding benign nature of longstanding hernia and avoidance of surgical intervention unless symptoms change.  Gastroesophageal reflux disease Reflux disease with prior anti-reflux surgery; currently experiencing mild symptoms suggestive of possible silent reflux contributing to dyspepsia and bloating, without classic heartburn or alarm symptoms.  Most annoying symptom it has been the bloating along with a sour taste in the mouth and uneasiness feeling of the stomach in the mornings. - Initiated omeprazole  20 mg daily for two weeks as a therapeutic trial. - Instructed to monitor for symptom improvement and may increase to standard  dose (40 mg) if partial response. - Advised to  pause omeprazole  after two weeks if H. pylori testing is performed, then resume after testing. - Provided education on potential for reflux symptoms to recur post-surgery and rationale for acid suppression trial.  Possible history of H Pylori Remote positive breath test and empiric treatment, but eradication status unclear; recent antibiotic use precludes immediate retesting. Persistent bloating raises concern for persistent or recurrent infection, but no alarm symptoms.  Per review of care everywhere, unable to identify confirmation of positive breath test. - Plan to order H. pylori breath test, deferring for 3-4 weeks after completion of antibiotics and after a two-week washout from omeprazole .  Will do this once he is done trialing medication and he will let me know if he decides he wants to repeat testing. - Provided education regarding timing of testing to avoid false negatives due to recent antibiotics or acid suppression. - Deferred celiac serology per patient (offered today); will consider if symptoms persist or additional features develop.     Follow up   Follow up 8 weeks.    Update: Reviewed [facility] database tab and H. pylori breath test was negative in October 2025.  Charmaine Melia, MSN, [name]-BC, AGACNP-BC [facility] "

## 2024-12-17 ENCOUNTER — Encounter: Payer: Self-pay | Admitting: Gastroenterology

## 2024-12-17 ENCOUNTER — Ambulatory Visit: Admitting: Gastroenterology

## 2024-12-17 VITALS — BP 136/81 | HR 101 | Temp 98.5°F | Ht 73.0 in | Wt 238.2 lb

## 2024-12-17 DIAGNOSIS — K219 Gastro-esophageal reflux disease without esophagitis: Secondary | ICD-10-CM

## 2024-12-17 DIAGNOSIS — Z8619 Personal history of other infectious and parasitic diseases: Secondary | ICD-10-CM | POA: Diagnosis not present

## 2024-12-17 DIAGNOSIS — R140 Abdominal distension (gaseous): Secondary | ICD-10-CM | POA: Diagnosis not present

## 2024-12-17 MED ORDER — OMEPRAZOLE 20 MG PO CPDR: 20.0000 mg | DELAYED_RELEASE_CAPSULE | Freq: Every day | ORAL | 1 refills | Status: AC

## 2024-12-17 NOTE — Patient Instructions (Addendum)
 VISIT SUMMARY: During your visit, we discussed your persistent abdominal bloating and distention, which may be related to dietary factors, bacterial imbalance, or prior anti-reflux surgery. We also addressed your mild reflux symptoms and possible history of H. pylori infection.  YOUR PLAN: ABDOMINAL BLOATING AND DISTENTION: Chronic abdominal bloating and distention likely related to functional bloating, dietary factors, possible bacterial imbalance, and prior anti-reflux surgery. -Modify your diet to reduce gas-producing foods and consider lactose intolerance. -Try high-dose simethicone (Phayzme) and FDguard for relief. -Resume fiber supplementation, noting that it may initially increase gas. -Keep a food diary to identify dietary triggers. -We will consider a SIBO breath test if symptoms persist after these initial steps. -We will consider celiac serology if symptoms persist or new features develop.  GASTROESOPHAGEAL REFLUX DISEASE: Mild reflux symptoms possibly contributing to dyspepsia and bloating, without classic heartburn. -Take omeprazole  20 mg daily for potential reflux related bloating.  -Monitor for symptom improvement and increase to standard dose if needed. -Should consider retesting for H. Pylori. If you would like to proceed then you should let me know and you should stop your omeprazole  for 2 weeks.  -Reflux symptoms may recur post-surgery; this trial will help manage acid levels.  POSSIBLE HISTORY OF H. PYLORI: Remote positive breath test with unclear eradication status; recent antibiotics preclude immediate retesting. -We will order an H. pylori breath test in 3-4 weeks after you finish antibiotics and have a two-week break from omeprazole  - let me know if you would like to proceed.  -This timing helps avoid false negatives due to recent antibiotics or acid suppression.  Follow up in 8 weeks.   It was a pleasure to see you today. I want to create trusting relationships with  patients. If you receive a survey regarding your visit,  I greatly appreciate you taking time to fill this out on paper or through your MyChart. I value your feedback.  Charmaine Melia, MSN, [name]-BC, AGACNP-BC [facility]

## 2025-03-12 ENCOUNTER — Ambulatory Visit: Admitting: Family Medicine

## 2025-03-30 ENCOUNTER — Ambulatory Visit
